# Patient Record
Sex: Female | Born: 1990 | Hispanic: Yes | Marital: Single | Smoking: Never smoker
Health system: Southern US, Community
[De-identification: ages and names within clinical notes are randomized; demographics above are authoritative.]

## PROBLEM LIST (undated history)

## (undated) ENCOUNTER — Inpatient Hospital Stay: Payer: Self-pay

## (undated) DIAGNOSIS — C539 Malignant neoplasm of cervix uteri, unspecified: Secondary | ICD-10-CM

## (undated) HISTORY — PX: OTHER SURGICAL HISTORY: SHX169

---
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
## 2009-09-28 ENCOUNTER — Emergency Department: Payer: Self-pay | Admitting: Emergency Medicine

## 2010-05-22 ENCOUNTER — Emergency Department: Payer: Self-pay | Admitting: Internal Medicine

## 2012-05-22 ENCOUNTER — Emergency Department: Payer: Self-pay | Admitting: Emergency Medicine

## 2012-05-22 LAB — COMPREHENSIVE METABOLIC PANEL
Albumin: 3.8 g/dL (ref 3.4–5.0)
Alkaline Phosphatase: 45 U/L — ABNORMAL LOW (ref 50–136)
BUN: 16 mg/dL (ref 7–18)
Bilirubin,Total: 0.6 mg/dL (ref 0.2–1.0)
Co2: 24 mmol/L (ref 21–32)
EGFR (African American): >60
EGFR (Non-African Amer.): >60
Osmolality: 274 (ref 275–301)
Potassium: 3.4 mmol/L — ABNORMAL LOW (ref 3.5–5.1)
SGPT (ALT): 77 U/L (ref 12–78)
Sodium: 137 mmol/L (ref 136–145)

## 2012-05-22 LAB — URINALYSIS, COMPLETE
Glucose,UR: NEGATIVE mg/dL (ref 0–75)
Leukocyte Esterase: NEGATIVE
Nitrite: NEGATIVE
Ph: 6 (ref 4.5–8.0)
RBC,UR: 1 /HPF (ref 0–5)

## 2012-05-22 LAB — CBC
HCT: 36.5 % (ref 35.0–47.0)
MCH: 28.2 pg (ref 26.0–34.0)
MCHC: 34 g/dL (ref 32.0–36.0)
MCV: 83 fL (ref 80–100)
RBC: 4.41 10*6/uL (ref 3.80–5.20)
WBC: 12.9 10*3/uL — ABNORMAL HIGH (ref 3.6–11.0)

## 2012-05-22 LAB — PREGNANCY, URINE: Pregnancy Test, Urine: POSITIVE m[IU]/mL

## 2012-12-01 ENCOUNTER — Emergency Department: Payer: Self-pay | Admitting: Unknown Physician Specialty

## 2012-12-01 LAB — CBC
HCT: 40.8 % (ref 35.0–47.0)
MCV: 84 fL (ref 80–100)
Platelet: 404 10*3/uL (ref 150–440)
WBC: 13.7 10*3/uL — ABNORMAL HIGH (ref 3.6–11.0)

## 2012-12-01 LAB — URINALYSIS, COMPLETE
Blood: NEGATIVE
Glucose,UR: NEGATIVE mg/dL (ref 0–75)
Nitrite: NEGATIVE
Ph: 6 (ref 4.5–8.0)
Protein: 100
RBC,UR: 8 /HPF (ref 0–5)
Specific Gravity: 1.028 (ref 1.003–1.030)
Squamous Epithelial: 9

## 2012-12-01 LAB — COMPREHENSIVE METABOLIC PANEL
Anion Gap: 7 (ref 7–16)
BUN: 15 mg/dL (ref 7–18)
Chloride: 105 mmol/L (ref 98–107)
Co2: 26 mmol/L (ref 21–32)
EGFR (Non-African Amer.): >60
Glucose: 91 mg/dL (ref 65–99)
Osmolality: 276 (ref 275–301)
Potassium: 3.2 mmol/L — ABNORMAL LOW (ref 3.5–5.1)
SGOT(AST): 29 U/L (ref 15–37)
SGPT (ALT): 50 U/L (ref 12–78)
Total Protein: 9.2 g/dL — ABNORMAL HIGH (ref 6.4–8.2)

## 2012-12-01 LAB — HCG, QUANTITATIVE, PREGNANCY: Beta Hcg, Quant.: 43930 m[IU]/mL — ABNORMAL HIGH

## 2013-06-27 ENCOUNTER — Ambulatory Visit: Payer: Self-pay | Admitting: Family Medicine

## 2014-04-15 ENCOUNTER — Emergency Department: Payer: Self-pay | Admitting: Emergency Medicine

## 2016-06-12 ENCOUNTER — Encounter: Payer: Self-pay | Admitting: Emergency Medicine

## 2016-06-12 ENCOUNTER — Emergency Department
Admission: EM | Admit: 2016-06-12 | Discharge: 2016-06-12 | Disposition: A | Discharge status: Home / Self Care | Payer: Medicaid Other | Attending: Emergency Medicine | Admitting: Emergency Medicine

## 2016-06-12 DIAGNOSIS — N390 Urinary tract infection, site not specified: Secondary | ICD-10-CM

## 2016-06-12 DIAGNOSIS — N72 Inflammatory disease of cervix uteri: Secondary | ICD-10-CM | POA: Diagnosis not present

## 2016-06-12 DIAGNOSIS — R300 Dysuria: Secondary | ICD-10-CM | POA: Diagnosis present

## 2016-06-12 LAB — URINALYSIS COMPLETE WITH MICROSCOPIC (ARMC ONLY)
Bacteria, UA: NONE SEEN
Bilirubin Urine: NEGATIVE
Glucose, UA: NEGATIVE mg/dL
Nitrite: NEGATIVE
PROTEIN: 100 mg/dL — AB
Specific Gravity, Urine: 1.029 (ref 1.005–1.030)
pH: 5 (ref 5.0–8.0)

## 2016-06-12 LAB — CHLAMYDIA/NGC RT PCR (ARMC ONLY)
Chlamydia Tr: NOT DETECTED
N gonorrhoeae: NOT DETECTED

## 2016-06-12 LAB — WET PREP, GENITAL
Clue Cells Wet Prep HPF POC: NONE SEEN
SPERM: NONE SEEN
Trich, Wet Prep: NONE SEEN
WBC, Wet Prep HPF POC: NONE SEEN
Yeast Wet Prep HPF POC: NONE SEEN

## 2016-06-12 LAB — POCT PREGNANCY, URINE: PREG TEST UR: NEGATIVE

## 2016-06-12 MED ORDER — CEFTRIAXONE SODIUM 1 G IJ SOLR
500.0000 mg | Freq: Once | INTRAMUSCULAR | Status: AC
  Administered 2016-06-12: 500 mg via INTRAMUSCULAR
  Filled 2016-06-12: qty 10

## 2016-06-12 MED ORDER — LIDOCAINE HCL (PF) 1 % IJ SOLN
2.1000 mL | Freq: Once | INTRAMUSCULAR | Status: AC
  Administered 2016-06-12: 2.1 mL
  Filled 2016-06-12: qty 5

## 2016-06-12 MED ORDER — CIPROFLOXACIN HCL 500 MG PO TABS: 500.0000 mg | ORAL_TABLET | Freq: Two times a day (BID) | ORAL | 0 refills | Status: DC

## 2016-06-12 MED ORDER — AZITHROMYCIN 500 MG PO TABS
1000.0000 mg | ORAL_TABLET | Freq: Every day | ORAL | Status: DC
  Administered 2016-06-12: 1000 mg via ORAL
  Filled 2016-06-12: qty 2

## 2016-06-12 MED ORDER — NAPROXEN 500 MG PO TABS: 500.0000 mg | ORAL_TABLET | Freq: Two times a day (BID) | ORAL | 0 refills | Status: DC

## 2016-06-12 NOTE — ED Triage Notes (Signed)
C/O dysuria and pelvic pain x 1 week.

## 2016-06-12 NOTE — ED Provider Notes (Signed)
[facility] Emergency Department Provider Note  ____________________________________________  Time seen: Approximately 6:45 PM  I have reviewed the triage vital signs and the nursing notes.   HISTORY  Chief Complaint Dysuria and Pelvic Pain    HPI Brenda Fox is a 25 y.o. female who presents to the emergency department for evaluation of dysuria x 1 week. Pain with intercourse. She is also concerned that she may be pregnant.Patient request that her husband/significant other not be present during discussion of HPI, exam, or review of labs/diagnosis.   History reviewed. No pertinent past medical history.  There are no active problems to display for this patient.   History reviewed. No pertinent surgical history.  Prior to Admission medications   Medication Sig Start Date End Date Taking? Authorizing Provider  ciprofloxacin (CIPRO) 500 MG tablet Take 1 tablet (500 mg total) by mouth 2 (two) times daily. 06/12/16   Victorino Dike, FNP  naproxen (NAPROSYN) 500 MG tablet Take 1 tablet (500 mg total) by mouth 2 (two) times daily with a meal. 06/12/16   Victorino Dike, FNP    Allergies Review of patient's allergies indicates no known allergies.  No family history on file.  Social History Social History  Substance Use Topics  . Smoking status: Never Smoker  . Smokeless tobacco: Never Used  . Alcohol use No    Review of Systems Constitutional: Positive for subjective fever. Respiratory: Negative for shortness of breath or cough. Gastrointestinal: Positive for abdominal pain; negative for nausea , negative for vomiting. Genitourinary: Positive for dysuria , negative for vaginal discharge. Musculoskeletal: Positive for back pain. Skin: Negative for lesion, rash, or wound. ____________________________________________   PHYSICAL EXAM:  VITAL SIGNS: ED Triage Vitals  Enc Vitals Group     BP 06/12/16 1758 122/70     Pulse Rate 06/12/16 1758 72   Resp 06/12/16 1758 16     Temp 06/12/16 1758 98.3 F (36.8 C)     Temp Source 06/12/16 1758 Oral     SpO2 06/12/16 1758 99 %     Weight 06/12/16 1759 150 lb (68 kg)     Height 06/12/16 1759 5\' 5"  (1.651 m)     Head Circumference --      Peak Flow --      Pain Score 06/12/16 1800 5     Pain Loc --      Pain Edu? --      Excl. in [place]? --     Constitutional: Alert and oriented. Well appearing and in no acute distress. Eyes: Conjunctivae are normal. PERRL. EOMI. Head: Atraumatic. Nose: No congestion/rhinnorhea. Mouth/Throat: Mucous membranes are moist. Respiratory: Normal respiratory effort.  No retractions. Gastrointestinal: Suprapubic tenderness on palpation. Genitourinary: Pelvic exam: No discharge noted in the vaginal vault or from the cervical os. Cervical Motion Tenderness present on bimanual exam.  Musculoskeletal: No extremity tenderness nor edema.  Neurologic:  Normal speech and language. No gross focal neurologic deficits are appreciated. Speech is normal. No gait instability. Skin:  Skin is warm, dry and intact. No rash noted. Psychiatric: Mood and affect are normal. Speech and behavior are normal.  ____________________________________________   LABS (all labs ordered are listed, but only abnormal results are displayed)  Labs Reviewed  URINALYSIS COMPLETEWITH MICROSCOPIC ([facility] ONLY) - Abnormal; Notable for the following:       Result Value   Color, Urine YELLOW (*)    APPearance CLOUDY (*)    Ketones, ur 1+ (*)    Hgb urine dipstick  1+ (*)    Protein, ur 100 (*)    Leukocytes, UA 3+ (*)    Squamous Epithelial / LPF 0-5 (*)    All other components within normal limits  CHLAMYDIA/NGC RT PCR ([facility] ONLY)  WET PREP, GENITAL  POC URINE PREG, ED  POCT PREGNANCY, URINE   ____________________________________________  RADIOLOGY  Not indicated.  ____________________________________________   PROCEDURES  Procedure(s) performed: Pelvic exam-see  assessment; specimens collected and sent to the lab.  ____________________________________________   INITIAL IMPRESSION / ASSESSMENT AND PLAN / ED COURSE  Clinical Course     Pertinent labs & imaging results that were available during my care of the patient were reviewed by me and considered in my medical decision making (see chart for details).  Chlamydia and gonorrhea testing pending. Due to CMT, IM injection of Rocephin and 1g of po azithromycin will be given in the emergency department. She will also be treated with Cipro for the UTI.  Patient will be given prescriptions for Cipro today. She was advised to follow up with gynecology for symptoms that are not improving over the week. She was advised to return to the ER for symptoms that change or worsen if unable to schedule an appointment.  Request for husband/significant other to not be present honored. Patient did state that she feels safe at home. ____________________________________________   FINAL CLINICAL IMPRESSION(S) / ED DIAGNOSES  Final diagnoses:  UTI (lower urinary tract infection)  Cervicitis, acute, non-specific    Note:  This document was prepared using Dragon voice recognition software and may include unintentional dictation errors.    Victorino Dike, FNP 06/12/16 2125    Hinda Kehr, MD 06/12/16 2321

## 2016-10-09 ENCOUNTER — Emergency Department
Admission: EM | Admit: 2016-10-09 | Discharge: 2016-10-09 | Disposition: A | Discharge status: Home / Self Care | Payer: Medicaid Other | Attending: Emergency Medicine | Admitting: Emergency Medicine

## 2016-10-09 DIAGNOSIS — R1110 Vomiting, unspecified: Secondary | ICD-10-CM | POA: Diagnosis present

## 2016-10-09 DIAGNOSIS — K529 Noninfective gastroenteritis and colitis, unspecified: Secondary | ICD-10-CM | POA: Insufficient documentation

## 2016-10-09 LAB — POCT PREGNANCY, URINE: Preg Test, Ur: NEGATIVE

## 2016-10-09 MED ORDER — ONDANSETRON HCL 4 MG/2ML IJ SOLN
4.0000 mg | Freq: Once | INTRAMUSCULAR | Status: AC
  Administered 2016-10-09: 4 mg via INTRAVENOUS
  Filled 2016-10-09: qty 2

## 2016-10-09 MED ORDER — ONDANSETRON HCL 4 MG PO TABS: 4.0000 mg | ORAL_TABLET | Freq: Every day | ORAL | 1 refills | Status: DC | PRN

## 2016-10-09 MED ORDER — ACETAMINOPHEN 325 MG PO TABS
650.0000 mg | ORAL_TABLET | Freq: Once | ORAL | Status: AC
  Administered 2016-10-09: 650 mg via ORAL
  Filled 2016-10-09: qty 2

## 2016-10-09 MED ORDER — SODIUM CHLORIDE 0.9 % IV BOLUS (SEPSIS)
1000.0000 mL | Freq: Once | INTRAVENOUS | Status: AC
  Administered 2016-10-09: 1000 mL via INTRAVENOUS

## 2016-10-09 NOTE — ED Notes (Signed)
See triage note   States she developed some vomiting yesterday  Thinks she may be pregnant   Denies any pain   Low grade fever

## 2016-10-09 NOTE — ED Triage Notes (Signed)
Pt started vomiting yesterday and is unable to hold anything down - denies diarrhea - c/o weakness - denies any pain

## 2016-10-09 NOTE — ED Provider Notes (Signed)
[facility] Emergency Department Provider Note  ____________________________________________  Time seen: Approximately 1:11 PM  I have reviewed the triage vital signs and the nursing notes.   HISTORY  Chief Complaint Emesis    HPI Brenda Fox is a 26 y.o. female that presents to the emergency department with one day of vomiting. Patient states that she has not been able to keep any food down. Patient has been drinking ginger ale without vomiting. She states that she would like to receive IV fluids. Patient denies any recent illness. Patient denies fever, congestion, sore throat, cough, shortness of breath, chest pain, abdominal pain, dysuria, diarrhea, constipation.   History reviewed. No pertinent past medical history.  There are no active problems to display for this patient.   History reviewed. No pertinent surgical history.  Prior to Admission medications   Medication Sig Start Date End Date Taking? Authorizing Provider  ondansetron (ZOFRAN) 4 MG tablet Take 1 tablet (4 mg total) by mouth daily as needed for nausea or vomiting. 10/09/16 10/09/17  Laban Emperor, PA-C    Allergies Patient has no known allergies.  No family history on file.  Social History Social History  Substance Use Topics  . Smoking status: Never Smoker  . Smokeless tobacco: Never Used  . Alcohol use No     Review of Systems  Constitutional: No fever/chills ENT: No upper respiratory complaints. Cardiovascular: No chest pain. Respiratory: No cough. No SOB. Gastrointestinal: No abdominal pain.   Genitourinary: Negative for dysuria. Musculoskeletal: Negative for musculoskeletal pain. Skin: Negative for rash, abrasions, lacerations, ecchymosis. Neurological: Negative for headaches, numbness or tingling   ____________________________________________   PHYSICAL EXAM:  VITAL SIGNS: ED Triage Vitals  Enc Vitals Group     BP 10/09/16 1209 124/64     Pulse Rate 10/09/16  1209 (!) 124     Resp 10/09/16 1209 16     Temp 10/09/16 1209 99.5 F (37.5 C)     Temp Source 10/09/16 1209 Oral     SpO2 10/09/16 1209 100 %     Weight 10/09/16 1210 150 lb (68 kg)     Height 10/09/16 1210 5\' 5"  (1.651 m)     Head Circumference --      Peak Flow --      Pain Score 10/09/16 1210 0     Pain Loc --      Pain Edu? --      Excl. in [place]? --      Constitutional: Alert and oriented. Well appearing and in no acute distress. Eyes: Conjunctivae are normal. PERRL. EOMI. Head: Atraumatic. ENT:      Ears:      Nose: No congestion/rhinnorhea.      Mouth/Throat: Mucous membranes are moist.  Neck: No stridor.   Cardiovascular: Normal rate, regular rhythm.  Good peripheral circulation. Respiratory: Normal respiratory effort without tachypnea or retractions. Lungs CTAB. Good air entry to the bases with no decreased or absent breath sounds. Gastrointestinal: Bowel sounds 4 quadrants. Soft and nontender to palpation. No guarding or rigidity. No palpable masses. No distention. No CVA tenderness. Musculoskeletal: Full range of motion to all extremities. No gross deformities appreciated. Neurologic:  Normal speech and language. No gross focal neurologic deficits are appreciated.  Skin:  Skin is warm, dry and intact. No rash noted. Psychiatric: Mood and affect are normal. Speech and behavior are normal. Patient exhibits appropriate insight and judgement.   ____________________________________________   LABS (all labs ordered are listed, but only abnormal results are displayed)  Labs Reviewed  POC URINE PREG, ED  POCT PREGNANCY, URINE   ____________________________________________  EKG   ____________________________________________  RADIOLOGY   No results found.  ____________________________________________    PROCEDURES  Procedure(s) performed:    Procedures    Medications  sodium chloride 0.9 % bolus 1,000 mL (1,000 mLs Intravenous New Bag/Given 10/09/16  1330)  ondansetron (ZOFRAN) injection 4 mg (4 mg Intravenous Given 10/09/16 1330)  acetaminophen (TYLENOL) tablet 650 mg (650 mg Oral Given 10/09/16 1331)     ____________________________________________   INITIAL IMPRESSION / ASSESSMENT AND PLAN / ED COURSE  Pertinent labs & imaging results that were available during my care of the patient were reviewed by me and considered in my medical decision making (see chart for details).  Review of the [place] CSRS was performed in accordance of the [place] prior to dispensing any controlled drugs.     Patient's diagnosis is consistent with gastroenteritis. Vital signs and exam are reassuring. Patient's abdomen is nontender to palpation. Patient felt better after IV fluids and Zofran. Patient is drinking ginger ale and eating crackers in ED without vomiting. Patient will be discharged home with prescriptions for Zofran. Patient is to follow up with PCP as directed. Patient is given ED precautions to return to the ED for any worsening or new symptoms.     ____________________________________________  FINAL CLINICAL IMPRESSION(S) / ED DIAGNOSES  Final diagnoses:  Gastroenteritis      NEW MEDICATIONS STARTED DURING THIS VISIT:  New Prescriptions   ONDANSETRON (ZOFRAN) 4 MG TABLET    Take 1 tablet (4 mg total) by mouth daily as needed for nausea or vomiting.        This chart was dictated using voice recognition software/Dragon. Despite best efforts to proofread, errors can occur which can change the meaning. Any change was purely unintentional.    Laban Emperor, PA-C 10/09/16 1615    Lavonia Drafts, MD 10/10/16 [phone]

## 2017-09-18 NOTE — L&D Delivery Note (Addendum)
Delivery Note At 9:23 PM a viable female was delivered via Vaginal, Spontaneous (Presentation:straight OA ) after pt was noted to be C/C/+4 station. Pt pushed x 2 with delivery of the vtx.: with no CAN, Vtx, ant and post shoulder and body del completely and to mom's abd. Very thick cord noted. Delayed cord clamping and clamped x 2 and daughter cut cord, 3 VC noted. Infant crying and pinkening to stimulation. SDOP intact at  2128 delivered completely in [place] presentation  and an accessory lobe noted but, still attached to Lt side of placenta, Cord insertion is central. Placenta insepected with no missing cotlyedons. No mec staining noted. ROM was never visualized nor AROM but at 1718, RN checked and did not palpate membranes only hair on fetal head so ruptured occurred prior to or around that time. Clear fluid noted at delivery. Cord blood not needed as pt is A pos. Baby to mom's chest for skin to skin after baby dried off.  Anesthesia:  Epidural for delivery Episiotomy: None Lacerations: None Suture Repair: None Est. Blood Loss (mL):  100 ml's Apgars:9/9 Weight: #  oz Mom to post partum status.  Baby to mom's chest for skin to skin bonding. No needles or sponges used. VSS. Hemostasis achieved. Pt has not named baby yet.   ___________________________ Danford Bad, MSN, CNM, FNP Certified Nurse Midwife [facility]

## 2017-09-24 ENCOUNTER — Encounter: Payer: Self-pay | Admitting: Intensive Care

## 2017-09-24 ENCOUNTER — Emergency Department: Payer: Medicaid Other

## 2017-09-24 ENCOUNTER — Emergency Department
Admission: EM | Admit: 2017-09-24 | Discharge: 2017-09-24 | Disposition: A | Discharge status: Home / Self Care | Payer: Medicaid Other | Attending: Emergency Medicine | Admitting: Emergency Medicine

## 2017-09-24 DIAGNOSIS — O219 Vomiting of pregnancy, unspecified: Secondary | ICD-10-CM | POA: Diagnosis present

## 2017-09-24 DIAGNOSIS — Z3490 Encounter for supervision of normal pregnancy, unspecified, unspecified trimester: Secondary | ICD-10-CM

## 2017-09-24 DIAGNOSIS — Z3A01 Less than 8 weeks gestation of pregnancy: Secondary | ICD-10-CM | POA: Diagnosis not present

## 2017-09-24 LAB — URINALYSIS, COMPLETE (UACMP) WITH MICROSCOPIC
BACTERIA UA: NONE SEEN
Bilirubin Urine: NEGATIVE
GLUCOSE, UA: NEGATIVE mg/dL
Hgb urine dipstick: NEGATIVE
KETONES UR: 20 mg/dL — AB
Leukocytes, UA: NEGATIVE
NITRITE: NEGATIVE
PROTEIN: NEGATIVE mg/dL
Specific Gravity, Urine: 1.025 (ref 1.005–1.030)
pH: 5 (ref 5.0–8.0)

## 2017-09-24 LAB — HCG, QUANTITATIVE, PREGNANCY: hCG, Beta Chain, Quant, S: 80260 m[IU]/mL — ABNORMAL HIGH (ref <5)

## 2017-09-24 LAB — CBC WITH DIFFERENTIAL/PLATELET
BASOS ABS: 0 10*3/uL (ref 0–0.1)
BASOS PCT: 0 %
EOS ABS: 0 10*3/uL (ref 0–0.7)
Eosinophils Relative: 0 %
HEMATOCRIT: 38.4 % (ref 35.0–47.0)
Hemoglobin: 12.7 g/dL (ref 12.0–16.0)
Lymphocytes Relative: 10 %
Lymphs Abs: 1.4 10*3/uL (ref 1.0–3.6)
MCH: 27.6 pg (ref 26.0–34.0)
MCHC: 33.1 g/dL (ref 32.0–36.0)
MCV: 83.3 fL (ref 80.0–100.0)
Monocytes Absolute: 0.5 10*3/uL (ref 0.2–0.9)
Monocytes Relative: 4 %
NEUTROS ABS: 11.8 10*3/uL — AB (ref 1.4–6.5)
NEUTROS PCT: 86 %
Platelets: 379 10*3/uL (ref 150–440)
RBC: 4.6 MIL/uL (ref 3.80–5.20)
RDW: 13.8 % (ref 11.5–14.5)
WBC: 13.8 10*3/uL — AB (ref 3.6–11.0)

## 2017-09-24 LAB — COMPREHENSIVE METABOLIC PANEL
ALK PHOS: 49 U/L (ref 38–126)
ALT: 20 U/L (ref 14–54)
ANION GAP: 7 (ref 5–15)
AST: 20 U/L (ref 15–41)
Albumin: 3.8 g/dL (ref 3.5–5.0)
BILIRUBIN TOTAL: 0.6 mg/dL (ref 0.3–1.2)
BUN: 11 mg/dL (ref 6–20)
CALCIUM: 9.1 mg/dL (ref 8.9–10.3)
CO2: 23 mmol/L (ref 22–32)
Chloride: 104 mmol/L (ref 101–111)
Creatinine, Ser: 0.65 mg/dL (ref 0.44–1.00)
GFR calc Af Amer: >60 mL/min (ref >60)
GFR calc non Af Amer: >60 mL/min (ref >60)
GLUCOSE: 86 mg/dL (ref 65–99)
Potassium: 3.6 mmol/L (ref 3.5–5.1)
SODIUM: 134 mmol/L — AB (ref 135–145)
TOTAL PROTEIN: 7.4 g/dL (ref 6.5–8.1)

## 2017-09-24 LAB — POCT PREGNANCY, URINE: Preg Test, Ur: POSITIVE — AB

## 2017-09-24 MED ORDER — PRENATAL VITAMINS 0.8 MG PO TABS: 1.0000 | ORAL_TABLET | Freq: Every day | ORAL | 0 refills | Status: DC

## 2017-09-24 MED ORDER — SODIUM CHLORIDE 0.9 % IV BOLUS (SEPSIS)
1000.0000 mL | Freq: Once | INTRAVENOUS | Status: AC
  Administered 2017-09-24: 1000 mL via INTRAVENOUS

## 2017-09-24 MED ORDER — VITAMIN B-6 25 MG PO TABS: ORAL_TABLET | ORAL | 0 refills | Status: DC

## 2017-09-24 MED ORDER — ONDANSETRON HCL 4 MG/2ML IJ SOLN
4.0000 mg | Freq: Once | INTRAMUSCULAR | Status: AC
  Administered 2017-09-24: 4 mg via INTRAVENOUS
  Filled 2017-09-24: qty 2

## 2017-09-24 MED ORDER — ONDANSETRON HCL 4 MG PO TABS: 4.0000 mg | ORAL_TABLET | Freq: Three times a day (TID) | ORAL | 0 refills | Status: DC | PRN

## 2017-09-24 NOTE — Discharge Instructions (Signed)
Follow up with Brenda Fox or Ob/gyn for prenatal care and for repeat ultrasound to confirm mass on ovary is a cyst.

## 2017-09-24 NOTE — ED Provider Notes (Signed)
-----------------------------------------   5:53 PM on 09/24/2017 -----------------------------------------   Blood pressure 122/64, pulse 70, temperature 98.6 F (37 C), temperature source Oral, resp. rate 16, height 5\' 5"  (1.651 m), weight 68 kg (150 lb), last menstrual period 08/24/2017, SpO2 99 %.  Assuming care from Ashok Cordia.  In short, Brenda Fox is a 27 y.o. female with a chief complaint of Emesis . Refer to the original H&P for additional details. Pregnancy test in ED is positive. Ultrasound in ED shows intrauterine pregnancy with a gestational age of [redacted] weeks and 4 days.  Ultrasound also indicated septated cystic structure in left ovary that is consistent with a possible hemorrhagic cyst. All findings were discussed with patient. She was given a prescription for prenatal vitamins, vitamin B6 and zofran for nausea and vomiting. Patient will follow up with PCP and Ob/gyn for repeat ultrasound and prenatal care.         Laban Emperor, PA-C 09/24/17 1757    Lavonia Drafts, MD 09/24/17 [phone]

## 2017-09-24 NOTE — ED Notes (Signed)
Awaiting for u/s  No vomiting since arrival to ED  Family at bedside

## 2017-09-24 NOTE — ED Notes (Addendum)
See triage note  States she developed some n/v about 4 days ago  Last time vomited was this am  Was able to eat yesterday   States she had subjective fever   But afebrile on arrival  Also had some lower abd pain yesterday  Denies any today

## 2017-09-24 NOTE — ED Provider Notes (Signed)
[facility] Emergency Department Provider Note  ____________________________________________   First MD Initiated Contact with Patient 09/24/17 1209     (approximate)  I have reviewed the triage vital signs and the nursing notes.   HISTORY  Chief Complaint Emesis    HPI Jenise Iannelli is a 27 y.o. female is complaining of vomiting for  4 days, she might have a fever but she just felt warm,  she did not take her temp, she denies any abdominal pain, vaginal discharge or bleeding, when questioned if she could be pregnant the patient just shrugged her shoulders  History reviewed. No pertinent past medical history.  There are no active problems to display for this patient.   History reviewed. No pertinent surgical history.  Prior to Admission medications   Medication Sig Start Date End Date Taking? Authorizing Provider  ondansetron (ZOFRAN) 4 MG tablet Take 1 tablet (4 mg total) by mouth daily as needed for nausea or vomiting. 10/09/16 10/09/17  Laban Emperor, PA-C    Allergies Patient has no known allergies.  History reviewed. No pertinent family history.  Social History Social History   Tobacco Use  . Smoking status: Never Smoker  . Smokeless tobacco: Never Used  Substance Use Topics  . Alcohol use: No  . Drug use: No    Review of Systems  Constitutional: No fever/chills Eyes: No visual changes. ENT: No sore throat. Respiratory: Denies cough GI: positive vomiting Genitourinary: Negative for dysuria. Musculoskeletal: Negative for back pain. Skin: Negative for rash.    ____________________________________________   PHYSICAL EXAM:  VITAL SIGNS: ED Triage Vitals  Enc Vitals Group     BP 09/24/17 1133 128/63     Pulse Rate 09/24/17 1133 75     Resp 09/24/17 1133 14     Temp 09/24/17 1135 98.6 F (37 C)     Temp Source 09/24/17 1133 Oral     SpO2 09/24/17 1133 100 %     Weight 09/24/17 1133 150 lb (68 kg)     Height 09/24/17  1133 5\' 5"  (1.651 m)     Head Circumference --      Peak Flow --      Pain Score --      Pain Loc --      Pain Edu? --      Excl. in [place]? --     Constitutional: Alert and oriented. Well appearing and in no acute distress. Eyes: Conjunctivae are normal.  Head: Atraumatic. Nose: No congestion/rhinnorhea. Mouth/Throat: Mucous membranes are moist.  Throat is normal Cardiovascular: Normal rate, regular rhythm. Heart sounds are normal Respiratory: Normal respiratory effort.  No retractions, lungs c t a GU: deferred Musculoskeletal: FROM all extremities, warm and well perfused Neurologic:  Normal speech and language.  Skin:  Skin is warm, dry and intact. No rash noted. Psychiatric: Mood and affect are normal. Speech and behavior are normal.  ____________________________________________   LABS (all labs ordered are listed, but only abnormal results are displayed)  Labs Reviewed  URINALYSIS, COMPLETE (UACMP) WITH MICROSCOPIC - Abnormal; Notable for the following components:      Result Value   Color, Urine YELLOW (*)    APPearance CLEAR (*)    Ketones, ur 20 (*)    Squamous Epithelial / LPF 0-5 (*)    All other components within normal limits  CBC WITH DIFFERENTIAL/PLATELET - Abnormal; Notable for the following components:   WBC 13.8 (*)    Neutro Abs 11.8 (*)    All  other components within normal limits  COMPREHENSIVE METABOLIC PANEL - Abnormal; Notable for the following components:   Sodium 134 (*)    All other components within normal limits  HCG, QUANTITATIVE, PREGNANCY - Abnormal; Notable for the following components:   hCG, Beta Chain, Quant, S 80,260 (*)    All other components within normal limits  POCT PREGNANCY, URINE - Abnormal; Notable for the following components:   Preg Test, Ur POSITIVE (*)    All other components within normal limits  POC URINE PREG, ED    ____________________________________________   ____________________________________________  RADIOLOGY    ____________________________________________   PROCEDURES  Procedure(s) performed: saline lock, ns 1liter, zofran 4mg , labs ordered      ____________________________________________   INITIAL IMPRESSION / ASSESSMENT AND PLAN / ED COURSE  Pertinent labs & imaging results that were available during my care of the patient were reviewed by me and considered in my medical decision making (see chart for details).  Patient is a 27 year old female presents to the emergency department due to vomiting for 4 days, pregnancy status is unknown, on physical exam she appears well, urine pregnancy test is positive, there are 20 ketones in her urine, IV saline 1 L, 4 mg of Zofran, with labs ordered    ----------------------------------------- 3:06 PM on 09/24/2017 -----------------------------------------  Patient was sent to ultrasound for pelvic and transvaginal, she states she did have some pain yesterday so we will need to ensure this is not an ectopic, discussed the patient with Jen Mow, PA-C she is taking over the care of this patient at this time As part of my medical decision making, I reviewed the following data within the [place]  ____________________________________________   FINAL CLINICAL IMPRESSION(S) / ED DIAGNOSES  Final diagnoses:  Pregnancy, unspecified gestational age  Nausea and vomiting in pregnancy prior to [redacted] weeks gestation      NEW MEDICATIONS STARTED DURING THIS VISIT:  This SmartLink is deprecated. Use AVSMEDLIST instead to display the medication list for a patient.   Note:  This document was prepared using Dragon voice recognition software and may include unintentional dictation errors.    Versie Starks, PA-C 09/24/17 1507    Harvest Dark, MD 09/24/17 [phone]

## 2017-09-24 NOTE — ED Triage Notes (Addendum)
Patient c/o fever and emesis for a couple of days. Ambulatory in triage with no problems. A&O x4. Denies taking any OTC meds today. No fever upon arrival. No distress noted

## 2017-09-28 ENCOUNTER — Other Ambulatory Visit: Payer: Self-pay

## 2017-09-28 ENCOUNTER — Encounter: Payer: Self-pay | Admitting: Emergency Medicine

## 2017-09-28 ENCOUNTER — Emergency Department
Admission: EM | Admit: 2017-09-28 | Discharge: 2017-09-28 | Disposition: A | Discharge status: Home / Self Care | Payer: Medicaid Other | Attending: Emergency Medicine | Admitting: Emergency Medicine

## 2017-09-28 DIAGNOSIS — O210 Mild hyperemesis gravidarum: Secondary | ICD-10-CM

## 2017-09-28 LAB — CBC WITH DIFFERENTIAL/PLATELET
Basophils Absolute: 0.1 10*3/uL (ref 0–0.1)
Basophils Relative: 1 %
Eosinophils Absolute: 0 10*3/uL (ref 0–0.7)
Eosinophils Relative: 0 %
HEMATOCRIT: 42.4 % (ref 35.0–47.0)
HEMOGLOBIN: 14 g/dL (ref 12.0–16.0)
LYMPHS ABS: 1.5 10*3/uL (ref 1.0–3.6)
LYMPHS PCT: 9 %
MCH: 27.2 pg (ref 26.0–34.0)
MCHC: 33 g/dL (ref 32.0–36.0)
MCV: 82.4 fL (ref 80.0–100.0)
MONOS PCT: 4 %
Monocytes Absolute: 0.7 10*3/uL (ref 0.2–0.9)
NEUTROS ABS: 15.1 10*3/uL — AB (ref 1.4–6.5)
NEUTROS PCT: 86 %
Platelets: 441 10*3/uL — ABNORMAL HIGH (ref 150–440)
RBC: 5.15 MIL/uL (ref 3.80–5.20)
RDW: 13.3 % (ref 11.5–14.5)
WBC: 17.3 10*3/uL — ABNORMAL HIGH (ref 3.6–11.0)

## 2017-09-28 LAB — BASIC METABOLIC PANEL
Anion gap: 12 (ref 5–15)
BUN: 18 mg/dL (ref 6–20)
CHLORIDE: 104 mmol/L (ref 101–111)
CO2: 22 mmol/L (ref 22–32)
Calcium: 9.7 mg/dL (ref 8.9–10.3)
Creatinine, Ser: 0.82 mg/dL (ref 0.44–1.00)
GFR calc Af Amer: >60 mL/min (ref >60)
GFR calc non Af Amer: >60 mL/min (ref >60)
GLUCOSE: 130 mg/dL — AB (ref 65–99)
POTASSIUM: 3.6 mmol/L (ref 3.5–5.1)
SODIUM: 138 mmol/L (ref 135–145)

## 2017-09-28 LAB — HEPATIC FUNCTION PANEL
ALBUMIN: 4.3 g/dL (ref 3.5–5.0)
ALT: 28 U/L (ref 14–54)
AST: 24 U/L (ref 15–41)
Alkaline Phosphatase: 54 U/L (ref 38–126)
Total Bilirubin: 0.8 mg/dL (ref 0.3–1.2)
Total Protein: 8.5 g/dL — ABNORMAL HIGH (ref 6.5–8.1)

## 2017-09-28 LAB — LIPASE, BLOOD: Lipase: 38 U/L (ref 11–51)

## 2017-09-28 MED ORDER — PROMETHAZINE HCL 25 MG/ML IJ SOLN
12.5000 mg | Freq: Once | INTRAMUSCULAR | Status: AC
  Administered 2017-09-28: 12.5 mg via INTRAVENOUS

## 2017-09-28 MED ORDER — LACTATED RINGERS IV BOLUS (SEPSIS)
1000.0000 mL | Freq: Once | INTRAVENOUS | Status: AC
  Administered 2017-09-28: 1000 mL via INTRAVENOUS
  Filled 2017-09-28: qty 1000

## 2017-09-28 MED ORDER — SODIUM CHLORIDE 0.9 % IV SOLN
1000.0000 mL | INTRAVENOUS | Status: DC
  Administered 2017-09-28: 1000 mL via INTRAVENOUS

## 2017-09-28 MED ORDER — ONDANSETRON HCL 4 MG/2ML IJ SOLN
4.0000 mg | Freq: Once | INTRAMUSCULAR | Status: AC
  Administered 2017-09-28: 4 mg via INTRAVENOUS
  Filled 2017-09-28: qty 2

## 2017-09-28 MED ORDER — PROMETHAZINE HCL 25 MG/ML IJ SOLN
INTRAMUSCULAR | Status: DC
Start: 2017-09-28 — End: 2017-09-28
  Filled 2017-09-28: qty 1

## 2017-09-28 MED ORDER — PROMETHAZINE HCL 12.5 MG PO TABS: 12.5000 mg | ORAL_TABLET | Freq: Four times a day (QID) | ORAL | 0 refills | Status: DC | PRN

## 2017-09-28 MED ORDER — SODIUM CHLORIDE 0.9 % IV BOLUS (SEPSIS): 1000.0000 mL | Freq: Once | INTRAVENOUS | Status: DC

## 2017-09-28 MED ORDER — PROMETHAZINE HCL 25 MG RE SUPP: 25.0000 mg | Freq: Four times a day (QID) | RECTAL | 1 refills | Status: DC | PRN

## 2017-09-28 NOTE — ED Triage Notes (Signed)
Pt reports that she is [redacted] weeks pregnant and has not been able to deep anything down since Tuesday. Pt mucous membranes are dry and she is actively vomiting during triage.

## 2017-09-28 NOTE — ED Provider Notes (Addendum)
[facility] Emergency Department Provider Note  ____________________________________________   I have reviewed the triage vital signs and the nursing notes. Where available I have reviewed prior notes and, if possible and indicated, outside hospital notes.    HISTORY  Chief Complaint Emesis During Pregnancy    HPI Brenda Fox is a 27 y.o. female who is G4 P1, 2 TAB in the past, with a known 7-week intrauterine pregnancy from recent ultrasound suffers from hyperemesis gravidarum, has been vomiting off and on for the last several days.  Has tried home Zofran with little success.  Denies any fever chills denies abdominal pain unless she is in the act  of vomiting at that time is epigastric cramping.Marland Kitchen  Has taken home Zofran and B6 with no relief, nothing else makes it better or worse.  No radiation pain is mild and only with vomiting, multiple bouts of emesis today and yesterday, has been seen for this on the seventh.    History reviewed. No pertinent past medical history.  There are no active problems to display for this patient.   History reviewed. No pertinent surgical history.  Prior to Admission medications   Medication Sig Start Date End Date Taking? Authorizing Provider  ondansetron (ZOFRAN) 4 MG tablet Take 1 tablet (4 mg total) by mouth 3 (three) times daily as needed for nausea or vomiting. 09/24/17 09/24/18  Laban Emperor, PA-C  Prenatal Multivit-Min-Fe-FA (PRENATAL VITAMINS) 0.8 MG tablet Take 1 tablet by mouth daily. 09/24/17   Laban Emperor, PA-C  vitamin B-6 (PYRIDOXINE) 25 MG tablet Take 1 tablet every 6-8 hours as needed for nausea and vomiting. 09/24/17   Laban Emperor, PA-C    Allergies Patient has no known allergies.  No family history on file.  Social History Social History   Tobacco Use  . Smoking status: Never Smoker  . Smokeless tobacco: Never Used  Substance Use Topics  . Alcohol use: No  . Drug use: No    Review of  Systems Constitutional: No fever/chills Eyes: No visual changes. ENT: No sore throat. No stiff neck no neck pain Cardiovascular: Denies chest pain. Respiratory: Denies shortness of breath. Gastrointestinal:   + vomiting.  No diarrhea.  No constipation. Genitourinary: Negative for dysuria. Musculoskeletal: Negative lower extremity swelling Skin: Negative for rash. Neurological: Negative for severe headaches, focal weakness or numbness.   ____________________________________________   PHYSICAL EXAM:  VITAL SIGNS: ED Triage Vitals  Enc Vitals Group     BP 09/28/17 1326 (!) 146/62     Pulse Rate 09/28/17 1326 74     Resp 09/28/17 1326 16     Temp 09/28/17 1326 98.4 F (36.9 C)     Temp Source 09/28/17 1326 Oral     SpO2 09/28/17 1326 100 %     Weight 09/28/17 1326 150 lb (68 kg)     Height 09/28/17 1326 5\' 5"  (1.651 m)     Head Circumference --      Peak Flow --      Pain Score 09/28/17 1325 0     Pain Loc --      Pain Edu? --      Excl. in [place]? --     Constitutional: Alert and oriented. Well appearing and in no acute distress. Eyes: Conjunctivae are normal Head: Atraumatic HEENT: No congestion/rhinnorhea. Mucous membranes are moist.  Oropharynx non-erythematous Neck:   Nontender with no meningismus, no masses, no stridor Cardiovascular: Normal rate, regular rhythm. Grossly normal heart sounds.  Good peripheral circulation. Respiratory:  Normal respiratory effort.  No retractions. Lungs CTAB. Abdominal: Soft and nontender. No distention. No guarding no rebound Back:  There is no focal tenderness or step off.  there is no midline tenderness there are no lesions noted. there is no CVA tenderness Musculoskeletal: No lower extremity tenderness, no upper extremity tenderness. No joint effusions, no DVT signs strong distal pulses no edema Neurologic:  Normal speech and language. No gross focal neurologic deficits are appreciated.  Skin:  Skin is warm, dry and intact. No rash  noted. Psychiatric: Mood and affect are normal. Speech and behavior are normal.  ____________________________________________   LABS (all labs ordered are listed, but only abnormal results are displayed)  Labs Reviewed  CBC WITH DIFFERENTIAL/PLATELET - Abnormal; Notable for the following components:      Result Value   WBC 17.3 (*)    Platelets 441 (*)    Neutro Abs 15.1 (*)    All other components within normal limits  BASIC METABOLIC PANEL - Abnormal; Notable for the following components:   Glucose, Bld 130 (*)    All other components within normal limits  URINALYSIS, COMPLETE (UACMP) WITH MICROSCOPIC    Pertinent labs  results that were available during my care of the patient were reviewed by me and considered in my medical decision making (see chart for details). ____________________________________________  EKG  I personally interpreted any EKGs ordered by me or triage  ____________________________________________  RADIOLOGY  Pertinent labs & imaging results that were available during my care of the patient were reviewed by me and considered in my medical decision making (see chart for details). If possible, patient and/or family made aware of any abnormal findings.  No results found. ____________________________________________    PROCEDURES  Procedure(s) performed: None  Procedures  Critical Care performed: None  ____________________________________________   INITIAL IMPRESSION / ASSESSMENT AND PLAN / ED COURSE  Pertinent labs & imaging results that were available during my care of the patient were reviewed by me and considered in my medical decision making (see chart for details).  Patient with hyperemesis gravidarum at this point there is nothing to suggest she has appendicitis gallbladder disease or other intra-abdominal pathology of that variety however we will check liver function tests and lipase, we are giving her IV fluid, will discuss with her OB  what they prefer to give her as she has not been able to keep anything down despite Zofran and B6 ----------------------------------------- 4:29 PM on 09/28/2017 -----------------------------------------  No vomiting here we will give her Phenergan, she did take a Zofran immediately prior to coming in but still complains of nausea.  Abdomen is soft and nontender, patient has been able to tolerate ice, if we can tolerate p.o. we will send her home with as needed p.o. Phenergan as needed.  I did discuss with Dr. Leafy Ro of OB/GYN she agrees with this plan and they will see her as an outpatient.  Appreciate the consult.    ____________________________________________   FINAL CLINICAL IMPRESSION(S) / ED DIAGNOSES  Final diagnoses:  None      This chart was dictated using voice recognition software.  Despite best efforts to proofread,  errors can occur which can change meaning.      Schuyler Amor, MD 09/28/17 1515    Schuyler Amor, MD 09/28/17 [phone]

## 2017-10-15 ENCOUNTER — Encounter: Payer: Self-pay | Admitting: Intensive Care

## 2017-10-15 ENCOUNTER — Emergency Department
Admission: EM | Admit: 2017-10-15 | Discharge: 2017-10-15 | Disposition: A | Discharge status: Home / Self Care | Payer: Medicaid Other | Attending: Emergency Medicine | Admitting: Emergency Medicine

## 2017-10-15 DIAGNOSIS — O2341 Unspecified infection of urinary tract in pregnancy, first trimester: Secondary | ICD-10-CM | POA: Diagnosis not present

## 2017-10-15 DIAGNOSIS — O210 Mild hyperemesis gravidarum: Secondary | ICD-10-CM | POA: Insufficient documentation

## 2017-10-15 DIAGNOSIS — O219 Vomiting of pregnancy, unspecified: Secondary | ICD-10-CM | POA: Diagnosis present

## 2017-10-15 DIAGNOSIS — Z3A09 9 weeks gestation of pregnancy: Secondary | ICD-10-CM | POA: Diagnosis not present

## 2017-10-15 DIAGNOSIS — N390 Urinary tract infection, site not specified: Secondary | ICD-10-CM

## 2017-10-15 LAB — URINALYSIS, ROUTINE W REFLEX MICROSCOPIC
Bilirubin Urine: NEGATIVE
Glucose, UA: NEGATIVE mg/dL
Hgb urine dipstick: NEGATIVE
KETONES UR: 80 mg/dL — AB
Leukocytes, UA: NEGATIVE
Nitrite: POSITIVE — AB
PROTEIN: NEGATIVE mg/dL
Specific Gravity, Urine: 1.023 (ref 1.005–1.030)
pH: 5 (ref 5.0–8.0)

## 2017-10-15 LAB — COMPREHENSIVE METABOLIC PANEL
ALBUMIN: 4.2 g/dL (ref 3.5–5.0)
ALT: 45 U/L (ref 14–54)
AST: 38 U/L (ref 15–41)
Alkaline Phosphatase: 51 U/L (ref 38–126)
Anion gap: 12 (ref 5–15)
BUN: 15 mg/dL (ref 6–20)
CHLORIDE: 103 mmol/L (ref 101–111)
CO2: 21 mmol/L — ABNORMAL LOW (ref 22–32)
Calcium: 9.7 mg/dL (ref 8.9–10.3)
Creatinine, Ser: 0.68 mg/dL (ref 0.44–1.00)
GFR calc Af Amer: >60 mL/min (ref >60)
Glucose, Bld: 109 mg/dL — ABNORMAL HIGH (ref 65–99)
POTASSIUM: 4 mmol/L (ref 3.5–5.1)
Sodium: 136 mmol/L (ref 135–145)
Total Bilirubin: 0.5 mg/dL (ref 0.3–1.2)
Total Protein: 8.3 g/dL — ABNORMAL HIGH (ref 6.5–8.1)

## 2017-10-15 LAB — CBC WITH DIFFERENTIAL/PLATELET
Basophils Absolute: 0.1 10*3/uL (ref 0–0.1)
Basophils Relative: 1 %
EOS PCT: 0 %
Eosinophils Absolute: 0 10*3/uL (ref 0–0.7)
HEMATOCRIT: 41.9 % (ref 35.0–47.0)
HEMOGLOBIN: 14 g/dL (ref 12.0–16.0)
LYMPHS PCT: 11 %
Lymphs Abs: 2.2 10*3/uL (ref 1.0–3.6)
MCH: 27.6 pg (ref 26.0–34.0)
MCHC: 33.5 g/dL (ref 32.0–36.0)
MCV: 82.4 fL (ref 80.0–100.0)
Monocytes Absolute: 0.8 10*3/uL (ref 0.2–0.9)
Monocytes Relative: 4 %
Neutro Abs: 17 10*3/uL — ABNORMAL HIGH (ref 1.4–6.5)
Neutrophils Relative %: 84 %
PLATELETS: 422 10*3/uL (ref 150–440)
RBC: 5.09 MIL/uL (ref 3.80–5.20)
RDW: 13.4 % (ref 11.5–14.5)
WBC: 20.1 10*3/uL — AB (ref 3.6–11.0)

## 2017-10-15 MED ORDER — METOCLOPRAMIDE HCL 10 MG PO TABS: 10.0000 mg | ORAL_TABLET | Freq: Three times a day (TID) | ORAL | 1 refills | Status: DC | PRN

## 2017-10-15 MED ORDER — SODIUM CHLORIDE 0.9 % IV BOLUS (SEPSIS)
1000.0000 mL | INTRAVENOUS | Status: AC
  Administered 2017-10-15: 1000 mL via INTRAVENOUS

## 2017-10-15 MED ORDER — PROMETHAZINE HCL 25 MG/ML IJ SOLN: 25.0000 mg | Freq: Once | INTRAMUSCULAR | Status: DC

## 2017-10-15 MED ORDER — PROMETHAZINE HCL 25 MG/ML IJ SOLN
12.5000 mg | Freq: Once | INTRAMUSCULAR | Status: AC
  Administered 2017-10-15: 12.5 mg via INTRAVENOUS
  Filled 2017-10-15: qty 1

## 2017-10-15 MED ORDER — CEPHALEXIN 500 MG PO CAPS: 500.0000 mg | ORAL_CAPSULE | Freq: Three times a day (TID) | ORAL | 0 refills | Status: DC

## 2017-10-15 MED ORDER — METOCLOPRAMIDE HCL 5 MG/ML IJ SOLN
INTRAMUSCULAR | Status: AC
  Administered 2017-10-15: 10 mg via INTRAVENOUS
  Filled 2017-10-15: qty 2

## 2017-10-15 MED ORDER — METOCLOPRAMIDE HCL 5 MG/ML IJ SOLN
10.0000 mg | INTRAMUSCULAR | Status: AC
  Administered 2017-10-15: 10 mg via INTRAVENOUS

## 2017-10-15 MED ORDER — DEXTROSE 5 % IV SOLN
1.0000 g | INTRAVENOUS | Status: AC
  Administered 2017-10-15: 1 g via INTRAVENOUS

## 2017-10-15 MED ORDER — CEFTRIAXONE SODIUM IN DEXTROSE 20 MG/ML IV SOLN
INTRAVENOUS | Status: AC
  Administered 2017-10-15: 1 g
  Filled 2017-10-15: qty 50

## 2017-10-15 NOTE — ED Provider Notes (Signed)
[facility] Emergency Department Provider Note  ____________________________________________   First MD Initiated Contact with Patient 10/15/17 1254     (approximate)  I have reviewed the triage vital signs and the nursing notes.   HISTORY  Chief Complaint Emesis During Pregnancy    HPI Brenda Fox is a 27 y.o. female G4 P1 at approximately [redacted] weeks gestation who presents for evaluation of persistent nausea and vomiting.  She states that after she was in the emergency department for the same issue about 2 weeks ago, her symptoms did improve on oral Phenergan.  However over the last couple of days the Phenergan seems to stop working and she is again vomiting frequently.  She was dry heaving in triage but she was drinking some soda when I went into see her and she states that she no longer has any nausea.  She feels generally weak.  She denies fever/chills, chest pain, shortness of breath, and vaginal bleeding.  She is also not had any dysuria.  She states the symptoms are severe and what concerned her today was there was some bright red blood in 1 of the last episodes of emesis.  She is currently in no acute distress.   After the last visit she followed up with Dr. Leonides Schanz to establish prenatal care.  She is having issues with Medicaid, however, so she is not certain if she will go to the health department or back to Dr. Leonides Schanz.   History reviewed. No pertinent past medical history.  There are no active problems to display for this patient.   History reviewed. No pertinent surgical history.  Prior to Admission medications   Medication Sig Start Date End Date Taking? Authorizing Provider  cephALEXin (KEFLEX) 500 MG capsule Take 1 capsule (500 mg total) by mouth 3 (three) times daily. 10/15/17   Hinda Kehr, MD  metoCLOPramide (REGLAN) 10 MG tablet Take 1 tablet (10 mg total) by mouth every 8 (eight) hours as needed for nausea or vomiting. 10/15/17 10/15/18  Hinda Kehr, MD  ondansetron (ZOFRAN) 4 MG tablet Take 1 tablet (4 mg total) by mouth 3 (three) times daily as needed for nausea or vomiting. 09/24/17 09/24/18  Laban Emperor, PA-C  Prenatal Multivit-Min-Fe-FA (PRENATAL VITAMINS) 0.8 MG tablet Take 1 tablet by mouth daily. 09/24/17   Laban Emperor, PA-C  promethazine (PHENERGAN) 12.5 MG tablet Take 1 tablet (12.5 mg total) by mouth every 6 (six) hours as needed for nausea or vomiting. 09/28/17   Schuyler Amor, MD  promethazine (PHENERGAN) 25 MG suppository Place 1 suppository (25 mg total) rectally every 6 (six) hours as needed for nausea. 09/28/17 09/28/18  Schuyler Amor, MD  vitamin B-6 (PYRIDOXINE) 25 MG tablet Take 1 tablet every 6-8 hours as needed for nausea and vomiting. 09/24/17   Laban Emperor, PA-C    Allergies Patient has no known allergies.  History reviewed. No pertinent family history.  Social History Social History   Tobacco Use  . Smoking status: Never Smoker  . Smokeless tobacco: Never Used  Substance Use Topics  . Alcohol use: No  . Drug use: No    Review of Systems Constitutional: No fever/chills.  General fatigue. Eyes: No visual changes. ENT: Mild sore throat when she vomits. Cardiovascular: Denies chest pain. Respiratory: Denies shortness of breath. Gastrointestinal: Persistent N/V.  Mild abd pain when vomiting.  One episode of BRB in emesis. Genitourinary: No vaginal bleeding. Negative for dysuria. Musculoskeletal: Negative for neck pain.  Negative for back pain.  Integumentary: Negative for rash. Neurological: Negative for headaches, focal weakness or numbness.   ____________________________________________   PHYSICAL EXAM:  VITAL SIGNS: ED Triage Vitals  Enc Vitals Group     BP 10/15/17 1232 114/61     Pulse Rate 10/15/17 1232 90     Resp 10/15/17 1232 16     Temp 10/15/17 1232 99 F (37.2 C)     Temp Source 10/15/17 1232 Oral     SpO2 10/15/17 1232 100 %     Weight 10/15/17 1235 68 kg (150 lb)      Height 10/15/17 1235 1.651 m (5\' 5" )     Head Circumference --      Peak Flow --      Pain Score 10/15/17 1235 7     Pain Loc --      Pain Edu? --      Excl. in [place]? --     Constitutional: Alert and oriented. Well appearing and in no acute distress. Eyes: Conjunctivae are normal.  Head: Atraumatic. Nose: No congestion/rhinnorhea. Mouth/Throat: Mucous membranes are moist. Neck: No stridor.  No meningeal signs.   Cardiovascular: Normal rate, regular rhythm. Good peripheral circulation. Grossly normal heart sounds. Respiratory: Normal respiratory effort.  No retractions. Lungs CTAB. Gastrointestinal: Soft and nontender throughout. Musculoskeletal: No lower extremity tenderness nor edema. No gross deformities of extremities. Neurologic:  Normal speech and language. No gross focal neurologic deficits are appreciated.  Skin:  Skin is warm, dry and intact. No rash noted. Psychiatric: Mood and affect are normal. Speech and behavior are normal.  ____________________________________________   LABS (all labs ordered are listed, but only abnormal results are displayed)  Labs Reviewed  COMPREHENSIVE METABOLIC PANEL - Abnormal; Notable for the following components:      Result Value   CO2 21 (*)    Glucose, Bld 109 (*)    Total Protein 8.3 (*)    All other components within normal limits  CBC WITH DIFFERENTIAL/PLATELET - Abnormal; Notable for the following components:   WBC 20.1 (*)    Neutro Abs 17.0 (*)    All other components within normal limits  URINALYSIS, ROUTINE W REFLEX MICROSCOPIC - Abnormal; Notable for the following components:   Color, Urine YELLOW (*)    APPearance HAZY (*)    Ketones, ur 80 (*)    Nitrite POSITIVE (*)    Bacteria, UA MANY (*)    Squamous Epithelial / LPF 0-5 (*)    All other components within normal limits   ____________________________________________  EKG  None - EKG not ordered by ED  physician ____________________________________________  RADIOLOGY   No results found.  ____________________________________________   PROCEDURES  Critical Care performed: No   Procedure(s) performed:   Procedures   ____________________________________________   INITIAL IMPRESSION / ASSESSMENT AND PLAN / ED COURSE  As part of my medical decision making, I reviewed the following data within the [place] History obtained from family, Labs reviewed  and Notes from prior ED visits    Differential diagnosis includes, but is not limited to, hyperemesis gravidarum, acute infection such as urinary tract infection, viral  Gastroenteritis, other complication of pregnancy.  Patient has had no vaginal bleeding and she had a reassuring ultrasound about 3 weeks ago.  No indication to repeat at this time.  This is her fourth pregnancy and she reports that she has had a lot of problems with nausea and vomiting each time.  I will give her fluids and antiemetics and reassess.  She has not yet provided a urine sample.  If her symptoms remain refractory to treatment she may require observation in the hospital but at this point she is generally well-appearing and tolerating some Coca-Cola.  Lab work is notable for a moderate leukocytosis could be the result of the persistent vomiting she has no electrolyte abnormalities.  She has no tenderness to palpation of the abdomen which is also reassuring and no respiratory symptoms.   Clinical Course as of Oct 15 1710  Mon Oct 15, 2017  1347 Non-specific leukocytosis in the setting of hyperemesis WBC: (!) 20.1 [CF]  1513 Patient has not yet urinated and still feels "weak".  She has not vomited anymore and has tolerated oral intake.  I will give her a second liter of fluids and reassess.  [CF]  1608 Patient reporting nausea again, will try Phenergan 12.5 mg IV.  Patient getting up to urinate now.  Getting 2nd liter of fluids.  [CF]  1651  Ketones, ur: (!) 80 [CF]  1651 Nitrite: (!) POSITIVE [CF]  [id number] Patient says that she feels a little bit nauseated but much better than before.  She has gotten 2 L of fluids and her vital signs are stable.  She does have ketonuria consistent with dehydration but she has gotten 2 L of fluids and clinically says that she is feeling better.  Additionally  [CF]  0174  she has had some Coca-Cola and drink a cup of Sprite with no additional nausea or vomiting.I offered to call the OB/GYN and admit the patient to the hospital for intractable vomiting/hyperemesis, but she prefers to go home and her husband is comfortable with that plan as well.  I will give her a prescription for oral Reglan and encouraged close outpatient follow-up.  I gave my usual and customary return precautions.   [CF]    Clinical Course User Index [CF] Hinda Kehr, MD    ____________________________________________  FINAL CLINICAL IMPRESSION(S) / ED DIAGNOSES  Final diagnoses:  Hyperemesis gravidarum  Urinary tract infection without hematuria, site unspecified     MEDICATIONS GIVEN DURING THIS VISIT:  Medications  cefTRIAXone (ROCEPHIN) 1 g in dextrose 5 % 50 mL IVPB (1 g Intravenous New Bag/Given 10/15/17 1655)  metoCLOPramide (REGLAN) injection 10 mg (10 mg Intravenous Given 10/15/17 1243)  sodium chloride 0.9 % bolus 1,000 mL (0 mLs Intravenous Stopped 10/15/17 1411)  sodium chloride 0.9 % bolus 1,000 mL (0 mLs Intravenous Stopped 10/15/17 1657)  promethazine (PHENERGAN) injection 12.5 mg (12.5 mg Intravenous Given 10/15/17 1615)  cefTRIAXone (ROCEPHIN) 20 MG/ML IVPB 50 mL (1 g  New Bag/Given 10/15/17 1654)     ED Discharge Orders        Ordered    cephALEXin (KEFLEX) 500 MG capsule  3 times daily     10/15/17 1655    metoCLOPramide (REGLAN) 10 MG tablet  Every 8 hours PRN     10/15/17 1655       Note:  This document was prepared using Dragon voice recognition software and may include unintentional dictation  errors.    Hinda Kehr, MD 10/15/17 [phone]

## 2017-10-15 NOTE — ED Triage Notes (Addendum)
Patient reports she is [redacted]weeks pregnant and here for nausea/vomiting. Has not scheduled appointment with OBGYN or started pre-natal care. Ambulatory in triage. No respiratory distress noted. C/O abdominal pain X2 days. Denies abnormal d/c or bleeding

## 2017-10-15 NOTE — ED Notes (Addendum)
Pt alert, oriented, ambulatory. States [redacted] weeks pregnant. Started having N&V. Dry heaving during assessment. No other distress noted. Pt states 2nd pregnancy.

## 2017-10-15 NOTE — Discharge Instructions (Signed)
Today we treated you for hyperemesis gravidarum (severe vomiting during pregnancy), dehydration, and a urinary tract infection.  We offered to bring you into the hospital for your symptoms, but since you are feeling better after 2 L of fluid and nausea medicine, you prefer to go home tonight, which we feel is appropriate.  However we encourage you to follow-up as soon as possible with your OB/GYN.  These read through the included information that has some good tips and recommendations for you.  Try to drink plenty of clear fluids that have electrolytes such as Gatorade or even Pedialyte.  Use the prescribed nausea medicine as needed, but do not take both the new medication today and the one you had from before.  Return to the emergency department if you develop new or worsening symptoms that concern you.

## 2017-10-15 NOTE — ED Triage Notes (Signed)
FIRST NURSE NOTE-here for vomiting. [redacted] weeks pregnant. Has no seen oBGYN, only here.  Has phenergan, not working.

## 2017-10-21 ENCOUNTER — Other Ambulatory Visit: Payer: Self-pay

## 2017-10-21 ENCOUNTER — Observation Stay
Admission: EM | Admit: 2017-10-21 | Discharge: 2017-10-22 | Disposition: A | Discharge status: Home / Self Care | Payer: Medicaid Other | Attending: Obstetrics and Gynecology | Admitting: Obstetrics and Gynecology

## 2017-10-21 ENCOUNTER — Encounter: Payer: Self-pay | Admitting: Emergency Medicine

## 2017-10-21 DIAGNOSIS — Z79899 Other long term (current) drug therapy: Secondary | ICD-10-CM | POA: Diagnosis not present

## 2017-10-21 DIAGNOSIS — O3680X0 Pregnancy with inconclusive fetal viability, not applicable or unspecified: Secondary | ICD-10-CM

## 2017-10-21 DIAGNOSIS — Z3A01 Less than 8 weeks gestation of pregnancy: Secondary | ICD-10-CM | POA: Diagnosis not present

## 2017-10-21 DIAGNOSIS — Z8759 Personal history of other complications of pregnancy, childbirth and the puerperium: Secondary | ICD-10-CM

## 2017-10-21 DIAGNOSIS — O210 Mild hyperemesis gravidarum: Principal | ICD-10-CM | POA: Insufficient documentation

## 2017-10-21 DIAGNOSIS — R1110 Vomiting, unspecified: Secondary | ICD-10-CM

## 2017-10-21 LAB — COMPREHENSIVE METABOLIC PANEL
ALBUMIN: 4.2 g/dL (ref 3.5–5.0)
ALK PHOS: 48 U/L (ref 38–126)
ALT: 62 U/L — ABNORMAL HIGH (ref 14–54)
ANION GAP: 14 (ref 5–15)
AST: 39 U/L (ref 15–41)
BUN: 12 mg/dL (ref 6–20)
CHLORIDE: 99 mmol/L — AB (ref 101–111)
CO2: 22 mmol/L (ref 22–32)
Calcium: 9.6 mg/dL (ref 8.9–10.3)
Creatinine, Ser: 0.76 mg/dL (ref 0.44–1.00)
GFR calc Af Amer: >60 mL/min (ref >60)
GFR calc non Af Amer: >60 mL/min (ref >60)
GLUCOSE: 132 mg/dL — AB (ref 65–99)
Potassium: 3.5 mmol/L (ref 3.5–5.1)
SODIUM: 135 mmol/L (ref 135–145)
Total Bilirubin: 0.8 mg/dL (ref 0.3–1.2)
Total Protein: 8.5 g/dL — ABNORMAL HIGH (ref 6.5–8.1)

## 2017-10-21 LAB — URINALYSIS, COMPLETE (UACMP) WITH MICROSCOPIC
Bilirubin Urine: NEGATIVE
Glucose, UA: NEGATIVE mg/dL
HGB URINE DIPSTICK: NEGATIVE
KETONES UR: 80 mg/dL — AB
Leukocytes, UA: NEGATIVE
Nitrite: NEGATIVE
PROTEIN: NEGATIVE mg/dL
Specific Gravity, Urine: 1.018 (ref 1.005–1.030)
pH: 6 (ref 5.0–8.0)

## 2017-10-21 LAB — CBC
HEMATOCRIT: 40.8 % (ref 35.0–47.0)
HEMOGLOBIN: 13.6 g/dL (ref 12.0–16.0)
MCH: 27.5 pg (ref 26.0–34.0)
MCHC: 33.2 g/dL (ref 32.0–36.0)
MCV: 82.8 fL (ref 80.0–100.0)
Platelets: 400 10*3/uL (ref 150–440)
RBC: 4.93 MIL/uL (ref 3.80–5.20)
RDW: 13.4 % (ref 11.5–14.5)
WBC: 18.9 10*3/uL — ABNORMAL HIGH (ref 3.6–11.0)

## 2017-10-21 LAB — LIPASE, BLOOD: LIPASE: 45 U/L (ref 11–51)

## 2017-10-21 MED ORDER — KCL-LACTATED RINGERS-D5W 20 MEQ/L IV SOLN
INTRAVENOUS | Status: DC
  Administered 2017-10-21 – 2017-10-22 (×2): via INTRAVENOUS
  Filled 2017-10-21 (×6): qty 1000

## 2017-10-21 MED ORDER — ZOLPIDEM TARTRATE 5 MG PO TABS: 5.0000 mg | ORAL_TABLET | Freq: Every evening | ORAL | Status: DC | PRN

## 2017-10-21 MED ORDER — ACETAMINOPHEN 325 MG PO TABS: 650.0000 mg | ORAL_TABLET | ORAL | Status: DC | PRN

## 2017-10-21 MED ORDER — M.V.I. ADULT IV INJ
INJECTION | INTRAVENOUS | Status: DC
  Filled 2017-10-21: qty 10

## 2017-10-21 MED ORDER — CALCIUM CARBONATE ANTACID 500 MG PO CHEW: 2.0000 | CHEWABLE_TABLET | ORAL | Status: DC | PRN

## 2017-10-21 MED ORDER — SODIUM CHLORIDE 0.9 % IV SOLN
8.0000 mg | Freq: Three times a day (TID) | INTRAVENOUS | Status: DC
  Administered 2017-10-21 – 2017-10-22 (×2): 8 mg via INTRAVENOUS
  Filled 2017-10-21 (×6): qty 4

## 2017-10-21 MED ORDER — PROMETHAZINE HCL 25 MG/ML IJ SOLN
12.5000 mg | Freq: Once | INTRAMUSCULAR | Status: AC
  Administered 2017-10-21: 12.5 mg via INTRAVENOUS
  Filled 2017-10-21: qty 1

## 2017-10-21 MED ORDER — LACTATED RINGERS IV BOLUS (SEPSIS)
1000.0000 mL | Freq: Once | INTRAVENOUS | Status: AC
  Administered 2017-10-21: 1000 mL via INTRAVENOUS
  Filled 2017-10-21: qty 1000

## 2017-10-21 MED ORDER — LACTATED RINGERS IV BOLUS (SEPSIS): 2000.0000 mL | Freq: Once | INTRAVENOUS | Status: DC

## 2017-10-21 MED ORDER — SODIUM CHLORIDE 0.9 % IV SOLN
INTRAVENOUS | Status: DC
  Administered 2017-10-21 – 2017-10-22 (×3): via INTRAVENOUS
  Filled 2017-10-21 (×8): qty 1

## 2017-10-21 MED ORDER — SODIUM CHLORIDE 0.9 % IV BOLUS (SEPSIS)
1000.0000 mL | Freq: Once | INTRAVENOUS | Status: AC
  Administered 2017-10-21: 1000 mL via INTRAVENOUS

## 2017-10-21 MED ORDER — SODIUM CHLORIDE 0.9 % IV SOLN
25.0000 mg | INTRAVENOUS | Status: DC
  Filled 2017-10-21: qty 1

## 2017-10-21 MED ORDER — M.V.I. ADULT IV INJ
INTRAVENOUS | Status: DC
  Filled 2017-10-21: qty 10

## 2017-10-21 MED ORDER — DOCUSATE SODIUM 100 MG PO CAPS: 100.0000 mg | ORAL_CAPSULE | Freq: Every day | ORAL | Status: DC

## 2017-10-21 MED ORDER — THIAMINE HCL 100 MG/ML IJ SOLN
100.0000 mg | Freq: Every day | INTRAMUSCULAR | Status: DC
  Administered 2017-10-21: 100 mg via INTRAVENOUS
  Filled 2017-10-21 (×2): qty 1

## 2017-10-21 NOTE — ED Provider Notes (Signed)
.[facility] Emergency Department Provider Note  ____________________________________________   I have reviewed the triage vital signs and the nursing notes. Where available I have reviewed prior notes and, if possible and indicated, outside hospital notes.    HISTORY  Chief Complaint Emesis    HPI Brenda Fox is a 27 y.o. female who presents today complaining of continued vomiting.  Patient has been vomiting off and on for the last month.  She is in her early pregnancy.  She has had a known IUP by ultrasound which I have reviewed.  She denies any vaginal bleeding or significant abdominal pain, she has occasional cramping but mostly only in the physical active vomiting.  She has had trace blood in her vomit before but has not had that recently.  She vomited she states 40 times today.  She was seen here several days ago for the same thing and actually twice already this month.  She is followed by outpatient OB and has seen them.  She has had numerous interventions at home including Phenergan B6 PR Phenergan and Zofran none of which have helped.  She states that she is slightly less nauseated after the IV fluids and the Phenergan that she received on the way in.  She has not had any fever or chills or focal abdominal discomfort.  She denies any diarrhea she states she is having normal bowel States she did have similar symptoms with her past pregnancy, and she   History reviewed. No pertinent past medical history.  There are no active problems to display for this patient.   History reviewed. No pertinent surgical history.  Prior to Admission medications   Medication Sig Start Date End Date Taking? Authorizing Provider  cephALEXin (KEFLEX) 500 MG capsule Take 1 capsule (500 mg total) by mouth 3 (three) times daily. 10/15/17   Hinda Kehr, MD  metoCLOPramide (REGLAN) 10 MG tablet Take 1 tablet (10 mg total) by mouth every 8 (eight) hours as needed for nausea or  vomiting. 10/15/17 10/15/18  Hinda Kehr, MD  ondansetron (ZOFRAN) 4 MG tablet Take 1 tablet (4 mg total) by mouth 3 (three) times daily as needed for nausea or vomiting. 09/24/17 09/24/18  Laban Emperor, PA-C  Prenatal Multivit-Min-Fe-FA (PRENATAL VITAMINS) 0.8 MG tablet Take 1 tablet by mouth daily. 09/24/17   Laban Emperor, PA-C  promethazine (PHENERGAN) 12.5 MG tablet Take 1 tablet (12.5 mg total) by mouth every 6 (six) hours as needed for nausea or vomiting. 09/28/17   Schuyler Amor, MD  promethazine (PHENERGAN) 25 MG suppository Place 1 suppository (25 mg total) rectally every 6 (six) hours as needed for nausea. 09/28/17 09/28/18  Schuyler Amor, MD  vitamin B-6 (PYRIDOXINE) 25 MG tablet Take 1 tablet every 6-8 hours as needed for nausea and vomiting. 09/24/17   Laban Emperor, PA-C    Allergies Patient has no known allergies.  No family history on file.  Social History Social History   Tobacco Use  . Smoking status: Never Smoker  . Smokeless tobacco: Never Used  Substance Use Topics  . Alcohol use: No  . Drug use: No    Review of Systems Constitutional: No fever/chills Eyes: No visual changes. ENT: No sore throat. No stiff neck no neck pain Cardiovascular: Denies chest pain. Respiratory: Denies shortness of breath. Gastrointestinal:   +vomiting.  No diarrhea.  No constipation. Genitourinary: Negative for dysuria. Musculoskeletal: Negative lower extremity swelling Skin: Negative for rash. Neurological: Negative for severe headaches, focal weakness or numbness.  ____________________________________________   PHYSICAL EXAM:  VITAL SIGNS: ED Triage Vitals  Enc Vitals Group     BP 10/21/17 1314 128/80     Pulse Rate 10/21/17 1314 80     Resp 10/21/17 1314 16     Temp 10/21/17 1314 98.6 F (37 C)     Temp Source 10/21/17 1314 Oral     SpO2 10/21/17 1314 100 %     Weight 10/21/17 1315 145 lb (65.8 kg)     Height 10/21/17 1315 5\' 5"  (1.651 m)     Head Circumference  --      Peak Flow --      Pain Score 10/21/17 1315 7     Pain Loc --      Pain Edu? --      Excl. in [place]? --     Constitutional: Alert and oriented. Well appearing and in no acute distress. Eyes: Conjunctivae are normal Head: Atraumatic HEENT: No congestion/rhinnorhea. Mucous membranes are dry.  Oropharynx non-erythematous Neck:   Nontender with no meningismus, no masses, no stridor Cardiovascular: Normal rate, regular rhythm. Grossly normal heart sounds.  Good peripheral circulation. Respiratory: Normal respiratory effort.  No retractions. Lungs CTAB. Abdominal: Soft and nontender. No distention. No guarding no rebound Back:  There is no focal tenderness or step off.  there is no midline tenderness there are no lesions noted. there is no CVA tenderness Musculoskeletal: No lower extremity tenderness, no upper extremity tenderness. No joint effusions, no DVT signs strong distal pulses no edema Neurologic:  Normal speech and language. No gross focal neurologic deficits are appreciated.  Skin:  Skin is warm, dry and intact. No rash noted. Psychiatric: Mood and affect are normal. Speech and behavior are normal.  ____________________________________________   LABS (all labs ordered are listed, but only abnormal results are displayed)  Labs Reviewed  COMPREHENSIVE METABOLIC PANEL - Abnormal; Notable for the following components:      Result Value   Chloride 99 (*)    Glucose, Bld 132 (*)    Total Protein 8.5 (*)    ALT 62 (*)    All other components within normal limits  CBC - Abnormal; Notable for the following components:   WBC 18.9 (*)    All other components within normal limits  LIPASE, BLOOD  URINALYSIS, COMPLETE (UACMP) WITH MICROSCOPIC  POC URINE PREG, ED    Pertinent labs  results that were available during my care of the patient were reviewed by me and considered in my medical decision making (see chart for  details). ____________________________________________  EKG  I personally interpreted any EKGs ordered by me or triage  ____________________________________________  RADIOLOGY  Pertinent labs & imaging results that were available during my care of the patient were reviewed by me and considered in my medical decision making (see chart for details). If possible, patient and/or family made aware of any abnormal findings.  No results found. ____________________________________________    PROCEDURES  Procedure(s) performed: None  Procedures  Critical Care performed: None  ____________________________________________   INITIAL IMPRESSION / ASSESSMENT AND PLAN / ED COURSE  Pertinent labs & imaging results that were available during my care of the patient were reviewed by me and considered in my medical decision making (see chart for details).  Patient here with hyperemesis gravidarum for 1 month duration at least, low suspicion for other intra-abdominal pathology such as ectopic pregnancy, gallbladder disease etc.  Nothing to suggest appendicitis on her exam.  We are giving her IV fluid here  but I am concerned the patient had 80+ ketones in her urine during her prior visit and has not been able to tolerate p.o. medication very well at home.  I did discuss with Dr. Leafy Ro, very much appreciate the consult, Dr. Leafy Ro does agree and will admit the patient for further workup and observation.  We are giving her 2 more liters of lactated Ringer here.  Patient has not yet given [place] a urine sample.  She does not however appear to be toxic.    ____________________________________________   FINAL CLINICAL IMPRESSION(S) / ED DIAGNOSES  Final diagnoses:  Hyperemesis      This chart was dictated using voice recognition software.  Despite best efforts to proofread,  errors can occur which can change meaning.      Schuyler Amor, MD 10/21/17 [phone]

## 2017-10-21 NOTE — ED Notes (Signed)
Pt is [redacted] weeks pregnant. 4th pregnancy. States started vomiting again on Friday. Denies diarrhea.

## 2017-10-21 NOTE — ED Notes (Signed)
Pt assisted to restroom. Pt c/o dizzy when returning to bed. Pt blood pressure 121/74.

## 2017-10-21 NOTE — ED Notes (Signed)
Advices pt not to eat or drink anything, pt took a soda and drank after RN advised her not to eat or drink anything to help with nausea, soon after pt drank her soda she started to vomit what she ingested.

## 2017-10-21 NOTE — ED Notes (Signed)
Discussed with Dr. Kerman Passey about pt's presentation, received verbal order to administered 1L NS via IV and 12.5mg  of phenergan via IV

## 2017-10-21 NOTE — ED Triage Notes (Signed)
Pt reports she is [redacted] weeks pregnant not being able to eat or drink for past 3 days. Denies any other symptom at present

## 2017-10-21 NOTE — H&P (Signed)
[facility]TEPARTUM ADMISSION HISTORY AND PHYSICAL NOTE   History of Present Illness: Brenda Fox is a 27 y.o. G4P1021 at [redacted]w[redacted]d by early u/a admitted with nausea and vomiting of pregnancy. Present in prior pregnancy as well. Vomited 40x yesterday, is dizzy and has not been urinating often.  Has been seen in the ER 4 times this month for n/v in early pregnancy.  U/s on 09/24/17 found her to be [redacted]w[redacted]d with EDC of 05/16/18   Patient Active Problem List   Diagnosis Date Noted  . Hyperemesis arising during pregnancy 10/21/2017    History reviewed. No pertinent past medical history.  History reviewed. No pertinent surgical history.  OB History  Gravida Para Term Preterm AB Living  1            SAB TAB Ectopic Multiple Live Births               # Outcome Date GA Lbr Len/2nd Weight Sex Delivery Anes PTL Lv  1 Current               Social History   Socioeconomic History  . Marital status: Single    Spouse name: None  . Number of children: None  . Years of education: None  . Highest education level: None  Social Needs  . Financial resource strain: None  . Food insecurity - worry: None  . Food insecurity - inability: None  . Transportation needs - medical: None  . Transportation needs - non-medical: None  Occupational History  . None  Tobacco Use  . Smoking status: Never Smoker  . Smokeless tobacco: Never Used  Substance and Sexual Activity  . Alcohol use: No  . Drug use: No  . Sexual activity: None  Other Topics Concern  . None  Social History Narrative  . None    No family history on file.  No Known Allergies  Medications Prior to Admission  Medication Sig Dispense Refill Last Dose  . cephALEXin (KEFLEX) 500 MG capsule Take 1 capsule (500 mg total) by mouth 3 (three) times daily. 30 capsule 0 10/21/2017 at N/A  . metoCLOPramide (REGLAN) 10 MG tablet Take 1 tablet (10 mg total) by mouth every 8 (eight) hours as needed for nausea or vomiting. 30 tablet 1 PRN at  PRN  . ondansetron (ZOFRAN) 4 MG tablet Take 1 tablet (4 mg total) by mouth 3 (three) times daily as needed for nausea or vomiting. 6 tablet 0 PRN at PRN  . Prenatal Multivit-Min-Fe-FA (PRENATAL VITAMINS) 0.8 MG tablet Take 1 tablet by mouth daily. 60 tablet 0 10/21/2017 at AM  . promethazine (PHENERGAN) 12.5 MG tablet Take 1 tablet (12.5 mg total) by mouth every 6 (six) hours as needed for nausea or vomiting. 10 tablet 0 PRN at PRN  . promethazine (PHENERGAN) 25 MG suppository Place 1 suppository (25 mg total) rectally every 6 (six) hours as needed for nausea. 12 suppository 1 PRN at PRN  . vitamin B-6 (PYRIDOXINE) 25 MG tablet Take 1 tablet every 6-8 hours as needed for nausea and vomiting. 24 tablet 0 10/21/2017 at N/A    Review of Systems - Negative except as above  Vitals:  BP (!) 102/48 (BP Location: Right Arm)   Pulse 75   Temp 97.8 F (36.6 C) (Oral)   Resp 17   Ht 5\' 5"  (1.651 m)   Wt 65.8 kg (145 lb)   LMP 08/24/2017   SpO2 100%   BMI 24.13 kg/m  Physical Examination: CONSTITUTIONAL: Well-developed,  well-nourished female in no acute distress.  HENT:  Normocephalic, atraumatic, External right and left ear normal. Oropharynx is clear and moist EYES: Conjunctivae and EOM are normal. Pupils are equal, round, and reactive to light. No scleral icterus.  NECK: Normal range of motion, supple, no masses SKIN: Skin is warm and dry. No rash noted. Not diaphoretic. No erythema. No pallor. Elim: Alert and oriented to person, place, and time. Normal reflexes, muscle tone coordination. No cranial nerve deficit noted. PSYCHIATRIC: Normal mood and affect. Normal behavior. Normal judgment and thought content. CARDIOVASCULAR: Normal heart rate noted, regular rhythm RESPIRATORY: Effort and breath sounds normal, no problems with respiration noted ABDOMEN: Soft, nontender, nondistended, gravid. MUSCULOSKELETAL: Normal range of motion. No edema and no tenderness. 2+ distal pulses.   Labs:   Results for orders placed or performed during the hospital encounter of 10/21/17 (from the past 24 hour(s))  Lipase, blood   Collection Time: 10/21/17  1:17 PM  Result Value Ref Range   Lipase 45 11 - 51 U/L  Comprehensive metabolic panel   Collection Time: 10/21/17  1:17 PM  Result Value Ref Range   Sodium 135 135 - 145 mmol/L   Potassium 3.5 3.5 - 5.1 mmol/L   Chloride 99 (L) 101 - 111 mmol/L   CO2 22 22 - 32 mmol/L   Glucose, Bld 132 (H) 65 - 99 mg/dL   BUN 12 6 - 20 mg/dL   Creatinine, Ser 0.76 0.44 - 1.00 mg/dL   Calcium 9.6 8.9 - 10.3 mg/dL   Total Protein 8.5 (H) 6.5 - 8.1 g/dL   Albumin 4.2 3.5 - 5.0 g/dL   AST 39 15 - 41 U/L   ALT 62 (H) 14 - 54 U/L   Alkaline Phosphatase 48 38 - 126 U/L   Total Bilirubin 0.8 0.3 - 1.2 mg/dL   GFR calc non Af Amer >60 >60 mL/min   GFR calc Af Amer >60 >60 mL/min   Anion gap 14 5 - 15  CBC   Collection Time: 10/21/17  1:17 PM  Result Value Ref Range   WBC 18.9 (H) 3.6 - 11.0 K/uL   RBC 4.93 3.80 - 5.20 MIL/uL   Hemoglobin 13.6 12.0 - 16.0 g/dL   HCT 40.8 35.0 - 47.0 %   MCV 82.8 80.0 - 100.0 fL   MCH 27.5 26.0 - 34.0 pg   MCHC 33.2 32.0 - 36.0 g/dL   RDW 13.4 11.5 - 14.5 %   Platelets 400 150 - 440 K/uL  Urinalysis, Complete w Microscopic   Collection Time: 10/21/17  9:32 PM  Result Value Ref Range   Color, Urine AMBER (A) YELLOW   APPearance HAZY (A) CLEAR   Specific Gravity, Urine 1.018 1.005 - 1.030   pH 6.0 5.0 - 8.0   Glucose, UA NEGATIVE NEGATIVE mg/dL   Hgb urine dipstick NEGATIVE NEGATIVE   Bilirubin Urine NEGATIVE NEGATIVE   Ketones, ur 80 (A) NEGATIVE mg/dL   Protein, ur NEGATIVE NEGATIVE mg/dL   Nitrite NEGATIVE NEGATIVE   Leukocytes, UA NEGATIVE NEGATIVE   RBC / HPF 0-5 0 - 5 RBC/hpf   WBC, UA 0-5 0 - 5 WBC/hpf   Bacteria, UA RARE (A) NONE SEEN   Squamous Epithelial / LPF 0-5 (A) NONE SEEN   Mucus PRESENT   Comprehensive metabolic panel   Collection Time: 10/22/17  5:24 AM  Result Value Ref Range    Sodium 140 135 - 145 mmol/L   Potassium 3.6 3.5 - 5.1 mmol/L  Chloride 109 101 - 111 mmol/L   CO2 23 22 - 32 mmol/L   Glucose, Bld 88 65 - 99 mg/dL   BUN 10 6 - 20 mg/dL   Creatinine, Ser 0.56 0.44 - 1.00 mg/dL   Calcium 8.0 (L) 8.9 - 10.3 mg/dL   Total Protein 6.0 (L) 6.5 - 8.1 g/dL   Albumin 3.0 (L) 3.5 - 5.0 g/dL   AST 29 15 - 41 U/L   ALT 48 14 - 54 U/L   Alkaline Phosphatase 35 (L) 38 - 126 U/L   Total Bilirubin 0.5 0.3 - 1.2 mg/dL   GFR calc non Af Amer >60 >60 mL/min   GFR calc Af Amer >60 >60 mL/min   Anion gap 8 5 - 15  CBC   Collection Time: 10/22/17  5:24 AM  Result Value Ref Range   WBC 9.0 3.6 - 11.0 K/uL   RBC 3.77 (L) 3.80 - 5.20 MIL/uL   Hemoglobin 10.7 (L) 12.0 - 16.0 g/dL   HCT 31.4 (L) 35.0 - 47.0 %   MCV 83.1 80.0 - 100.0 fL   MCH 28.3 26.0 - 34.0 pg   MCHC 34.0 32.0 - 36.0 g/dL   RDW 13.5 11.5 - 14.5 %   Platelets 298 150 - 440 K/uL    Imaging Studies: US Ob Comp Less 14 Wks  Result Date: 09/24/2017 CLINICAL DATA:  Pain, early pregnancy. EXAM: OBSTETRIC <14 WK [place] AND TRANSVAGINAL OB US TECHNIQUE: Both transabdominal and transvaginal ultrasound examinations were performed for complete evaluation of the gestation as well as the maternal uterus, adnexal regions, and pelvic cul-de-sac. Transvaginal technique was performed to assess early pregnancy. COMPARISON:  None. FINDINGS: Intrauterine gestational sac: Visualized. Yolk sac:  Visualized. Embryo:  Visualized. Cardiac Activity: Visualized. Heart Rate: 126  bpm CRL:  6.6  mm   6 w   4 d                  [place] EDC: 05/16/2018 Subchorionic hemorrhage:  None visualized. Maternal uterus/adnexae: Ovaries are visualized. Corpus luteum cyst on the left. There is also a septated cystic structure in the left ovary, measuring 3.1 x 1.6 x 2.2 cm. Small free fluid. IMPRESSION: 1. Single living intrauterine pregnancy with gestational age [redacted] weeks 4 days and estimated date of confinement of 05/16/2018. 2. Possible hemorrhagic cyst in  the left ovary. Continued attention on followup exams is warranted as malignancy cannot be definitively excluded. 3. Small free fluid. Electronically Signed   By: Lorin Picket M.D.   On: 09/24/2017 15:40   US Ob Transvaginal  Result Date: 09/24/2017 CLINICAL DATA:  Pain, early pregnancy. EXAM: OBSTETRIC <14 WK [place] AND TRANSVAGINAL OB US TECHNIQUE: Both transabdominal and transvaginal ultrasound examinations were performed for complete evaluation of the gestation as well as the maternal uterus, adnexal regions, and pelvic cul-de-sac. Transvaginal technique was performed to assess early pregnancy. COMPARISON:  None. FINDINGS: Intrauterine gestational sac: Visualized. Yolk sac:  Visualized. Embryo:  Visualized. Cardiac Activity: Visualized. Heart Rate: 126  bpm CRL:  6.6  mm   6 w   4 d                  [place] EDC: 05/16/2018 Subchorionic hemorrhage:  None visualized. Maternal uterus/adnexae: Ovaries are visualized. Corpus luteum cyst on the left. There is also a septated cystic structure in the left ovary, measuring 3.1 x 1.6 x 2.2 cm. Small free fluid. IMPRESSION: 1. Single living intrauterine pregnancy with gestational age [redacted] weeks 25  days and estimated date of confinement of 05/16/2018. 2. Possible hemorrhagic cyst in the left ovary. Continued attention on followup exams is warranted as malignancy cannot be definitively excluded. 3. Small free fluid. Electronically Signed   By: Lorin Picket M.D.   On: 09/24/2017 15:40     Assessment and Plan: Patient Active Problem List   Diagnosis Date Noted  . Hyperemesis arising during pregnancy 10/21/2017   Admit to Antenatal  Scheduled antiemetics (zofran and phenergan) NPO Strict Is and Os Will check fetal heart tones  Benjaman Kindler 10/22/17

## 2017-10-22 LAB — COMPREHENSIVE METABOLIC PANEL
ALT: 48 U/L (ref 14–54)
ANION GAP: 8 (ref 5–15)
AST: 29 U/L (ref 15–41)
Albumin: 3 g/dL — ABNORMAL LOW (ref 3.5–5.0)
Alkaline Phosphatase: 35 U/L — ABNORMAL LOW (ref 38–126)
BUN: 10 mg/dL (ref 6–20)
CALCIUM: 8 mg/dL — AB (ref 8.9–10.3)
CHLORIDE: 109 mmol/L (ref 101–111)
CO2: 23 mmol/L (ref 22–32)
Creatinine, Ser: 0.56 mg/dL (ref 0.44–1.00)
GFR calc non Af Amer: >60 mL/min (ref >60)
Glucose, Bld: 88 mg/dL (ref 65–99)
Potassium: 3.6 mmol/L (ref 3.5–5.1)
SODIUM: 140 mmol/L (ref 135–145)
Total Bilirubin: 0.5 mg/dL (ref 0.3–1.2)
Total Protein: 6 g/dL — ABNORMAL LOW (ref 6.5–8.1)

## 2017-10-22 LAB — CBC
HCT: 31.4 % — ABNORMAL LOW (ref 35.0–47.0)
HEMOGLOBIN: 10.7 g/dL — AB (ref 12.0–16.0)
MCH: 28.3 pg (ref 26.0–34.0)
MCHC: 34 g/dL (ref 32.0–36.0)
MCV: 83.1 fL (ref 80.0–100.0)
Platelets: 298 10*3/uL (ref 150–440)
RBC: 3.77 MIL/uL — AB (ref 3.80–5.20)
RDW: 13.5 % (ref 11.5–14.5)
WBC: 9 10*3/uL (ref 3.6–11.0)

## 2017-10-22 LAB — URINALYSIS, ROUTINE W REFLEX MICROSCOPIC
Bilirubin Urine: NEGATIVE
Glucose, UA: NEGATIVE mg/dL
HGB URINE DIPSTICK: NEGATIVE
Ketones, ur: 80 mg/dL — AB
LEUKOCYTES UA: NEGATIVE
Nitrite: NEGATIVE
Protein, ur: NEGATIVE mg/dL
SPECIFIC GRAVITY, URINE: 1.019 (ref 1.005–1.030)
pH: 6 (ref 5.0–8.0)

## 2017-10-22 MED ORDER — ONDANSETRON 4 MG PO TBDP: 4.0000 mg | ORAL_TABLET | Freq: Four times a day (QID) | ORAL | 0 refills | Status: DC | PRN

## 2017-10-22 MED ORDER — SODIUM CHLORIDE 0.9 % IV SOLN
INTRAVENOUS | Status: DC
  Filled 2017-10-22 (×8): qty 0.5

## 2017-10-22 MED ORDER — ENSURE ENLIVE PO LIQD: 237.0000 mL | Freq: Two times a day (BID) | ORAL | Status: DC

## 2017-10-22 MED ORDER — ONDANSETRON 4 MG PO TBDP
4.0000 mg | ORAL_TABLET | Freq: Four times a day (QID) | ORAL | Status: DC | PRN
  Administered 2017-10-22 (×2): 4 mg via ORAL
  Filled 2017-10-22 (×2): qty 1

## 2017-10-22 MED ORDER — PROCHLORPERAZINE MALEATE 10 MG PO TABS
10.0000 mg | ORAL_TABLET | Freq: Four times a day (QID) | ORAL | Status: DC | PRN
  Filled 2017-10-22: qty 1

## 2017-10-22 MED ORDER — DOCUSATE SODIUM 100 MG PO CAPS: 100.0000 mg | ORAL_CAPSULE | Freq: Every day | ORAL | 0 refills | Status: DC

## 2017-10-22 NOTE — Progress Notes (Signed)
Discharged to home.  Ambulatory to car

## 2017-10-22 NOTE — Discharge Instructions (Signed)

## 2017-10-22 NOTE — Progress Notes (Signed)
Initial Nutrition Assessment  DOCUMENTATION CODES:   Not applicable  INTERVENTION:  Provide Ensure Enlive po BID, each supplement provides 350 kcal and 20 grams of protein. Patient prefers strawberry.  Reviewed "Morning Sickness Nutrition Therapy" handout with patient. Encouraged small, frequent meals with bland food. Encouraged adequate intake of protein. Discussed strategies for managing nausea.  Recommend daily prenatal vitamin and vitamin B6 10-50 mg Q8hrs.  NUTRITION DIAGNOSIS:   Inadequate oral intake related to acute illness(hyperemesis) as evidenced by per patient/family report.  GOAL:   Patient will meet greater than or equal to 90% of their needs  MONITOR:   PO intake, Supplement acceptance, Labs, Weight trends, Diet advancement, I & O's  REASON FOR ASSESSMENT:   Other (Comment)([place] report - 20# wt loss over 1 month)    ASSESSMENT:   27 year old female at 54w5dadmitted with nausea and vomiting of pregnancy, which was present in prior pregnancy as well. Of note, patient has been seen in ER 5 times this month for N/V.   Met with patient at bedside. She reports she has been experiencing constant N/V for the past 3 weeks since she found out she was pregnant. She reports the emesis is constant with every meal and drink she has. She is still trying to eat 3 meals per day and a snack at bedtime. She reports she also experienced the hyperemesis in her previous pregnancy. Reviewed handout with patient. She is requesting a strawberry protein shake. Patient reports she has her Prenatal MVI and vitamin B6 at home.  She reports her pre-pregnancy UBW was 165 lbs and she has lost 20 lbs over the past month. Does not align with weight history in chart. Per chart patient was 150 lbs pre-pregnancy. RD obtained bed scale weight of 155 lbs today.  Meal Completion: 75-100%  Medications reviewed and include: Colace, thiamine 100 mg daily IV, D5-LR with KCl 20 mEq/L at 110 mL/hr (132  grams dextrose, 449 kcal daily), Adult MVI IV, Zofran, Phenergan IV.  Labs reviewed and none pertinent.  Patient does not meet criteria for malnutrition at this time.  Discussed with RN.  NUTRITION - FOCUSED PHYSICAL EXAM:    Most Recent Value  Orbital Region  No depletion  Upper Arm Region  No depletion  Thoracic and Lumbar Region  No depletion  Buccal Region  No depletion  Temple Region  No depletion  Clavicle Bone Region  No depletion  Clavicle and Acromion Bone Region  No depletion  Scapular Bone Region  No depletion  Dorsal Hand  No depletion  Patellar Region  No depletion  Anterior Thigh Region  No depletion  Posterior Calf Region  No depletion  Edema (RD Assessment)  None  Hair  Reviewed  Eyes  Reviewed  Mouth  Reviewed  Skin  Reviewed  Nails  Reviewed     Diet Order:  DIET SOFT Room service appropriate? Yes; Fluid consistency: Thin  EDUCATION NEEDS:   Education needs have been addressed  Skin:  Skin Assessment: Reviewed RN Assessment  Last BM:  Unknown  Height:   Ht Readings from Last 1 Encounters:  10/21/17 5' 5"  (1.651 m)    Weight:   Wt Readings from Last 1 Encounters:  10/22/17 154 lb 15.7 oz (70.3 kg)    Ideal Body Weight:  56.8 kg  BMI:  Body mass index is 25.79 kg/m.  Estimated Nutritional Needs:   Kcal:  1580 (MSJ x 1.1)  Protein:  77-84 grams (1.1-1.2 grams/kg)  Fluid:  2.5  L/day (35 mL/kg)  Willey Blade, MS, RD, LDN Office: [phone] Pager: [phone] After Hours/Weekend Pager: [phone]

## 2017-10-22 NOTE — Progress Notes (Signed)
Dc to home.  To car with spouse.  No further N/V today.  Discussed importance of keeping Zofran in system

## 2017-10-22 NOTE — Progress Notes (Signed)
BS Ultrasound per Dr. Leafy Ro

## 2017-10-22 NOTE — Discharge Summary (Signed)
Patient ID: Brenda Fox MRN: [id number] DOB/AGE: [birth year] 27 y.o.  Admit date: 10/21/2017 Discharge date: 10/22/2017  Admission Diagnoses: Hyperemesis  Ultrasound scan to confirm fetal viability with history of miscarriage - Plan: CANCELED: US OB Limited, CANCELED: US OB Limited  Discharge Diagnoses: Hyperemesis  Prenatal Procedures: none  Consults: Neonatology, Maternal Fetal Medicine  Significant Diagnostic Studies:  Results for orders placed or performed during the hospital encounter of 10/21/17 (from the past 168 hour(s))  Lipase, blood   Collection Time: 10/21/17  1:17 PM  Result Value Ref Range   Lipase 45 11 - 51 U/L  Comprehensive metabolic panel   Collection Time: 10/21/17  1:17 PM  Result Value Ref Range   Sodium 135 135 - 145 mmol/L   Potassium 3.5 3.5 - 5.1 mmol/L   Chloride 99 (L) 101 - 111 mmol/L   CO2 22 22 - 32 mmol/L   Glucose, Bld 132 (H) 65 - 99 mg/dL   BUN 12 6 - 20 mg/dL   Creatinine, Ser 0.76 0.44 - 1.00 mg/dL   Calcium 9.6 8.9 - 10.3 mg/dL   Total Protein 8.5 (H) 6.5 - 8.1 g/dL   Albumin 4.2 3.5 - 5.0 g/dL   AST 39 15 - 41 U/L   ALT 62 (H) 14 - 54 U/L   Alkaline Phosphatase 48 38 - 126 U/L   Total Bilirubin 0.8 0.3 - 1.2 mg/dL   GFR calc non Af Amer >60 >60 mL/min   GFR calc Af Amer >60 >60 mL/min   Anion gap 14 5 - 15  CBC   Collection Time: 10/21/17  1:17 PM  Result Value Ref Range   WBC 18.9 (H) 3.6 - 11.0 K/uL   RBC 4.93 3.80 - 5.20 MIL/uL   Hemoglobin 13.6 12.0 - 16.0 g/dL   HCT 40.8 35.0 - 47.0 %   MCV 82.8 80.0 - 100.0 fL   MCH 27.5 26.0 - 34.0 pg   MCHC 33.2 32.0 - 36.0 g/dL   RDW 13.4 11.5 - 14.5 %   Platelets 400 150 - 440 K/uL  Urinalysis, Complete w Microscopic   Collection Time: 10/21/17  9:32 PM  Result Value Ref Range   Color, Urine AMBER (A) YELLOW   APPearance HAZY (A) CLEAR   Specific Gravity, Urine 1.018 1.005 - 1.030   pH 6.0 5.0 - 8.0   Glucose, UA NEGATIVE NEGATIVE mg/dL   Hgb urine dipstick NEGATIVE NEGATIVE   Bilirubin Urine NEGATIVE NEGATIVE   Ketones, ur 80 (A) NEGATIVE mg/dL   Protein, ur NEGATIVE NEGATIVE mg/dL   Nitrite NEGATIVE NEGATIVE   Leukocytes, UA NEGATIVE NEGATIVE   RBC / HPF 0-5 0 - 5 RBC/hpf   WBC, UA 0-5 0 - 5 WBC/hpf   Bacteria, UA RARE (A) NONE SEEN   Squamous Epithelial / LPF 0-5 (A) NONE SEEN   Mucus PRESENT   Urinalysis, Routine w reflex microscopic   Collection Time: 10/21/17  9:32 PM  Result Value Ref Range   Color, Urine AMBER (A) YELLOW   APPearance CLEAR (A) CLEAR   Specific Gravity, Urine 1.019 1.005 - 1.030   pH 6.0 5.0 - 8.0   Glucose, UA NEGATIVE NEGATIVE mg/dL   Hgb urine dipstick NEGATIVE NEGATIVE   Bilirubin Urine NEGATIVE NEGATIVE   Ketones, ur 80 (A) NEGATIVE mg/dL   Protein, ur NEGATIVE NEGATIVE mg/dL   Nitrite NEGATIVE NEGATIVE   Leukocytes, UA NEGATIVE NEGATIVE  Comprehensive metabolic panel   Collection Time: 10/22/17  5:24 AM  Result Value Ref  Range   Sodium 140 135 - 145 mmol/L   Potassium 3.6 3.5 - 5.1 mmol/L   Chloride 109 101 - 111 mmol/L   CO2 23 22 - 32 mmol/L   Glucose, Bld 88 65 - 99 mg/dL   BUN 10 6 - 20 mg/dL   Creatinine, Ser 0.56 0.44 - 1.00 mg/dL   Calcium 8.0 (L) 8.9 - 10.3 mg/dL   Total Protein 6.0 (L) 6.5 - 8.1 g/dL   Albumin 3.0 (L) 3.5 - 5.0 g/dL   AST 29 15 - 41 U/L   ALT 48 14 - 54 U/L   Alkaline Phosphatase 35 (L) 38 - 126 U/L   Total Bilirubin 0.5 0.3 - 1.2 mg/dL   GFR calc non Af Amer >60 >60 mL/min   GFR calc Af Amer >60 >60 mL/min   Anion gap 8 5 - 15  CBC   Collection Time: 10/22/17  5:24 AM  Result Value Ref Range   WBC 9.0 3.6 - 11.0 K/uL   RBC 3.77 (L) 3.80 - 5.20 MIL/uL   Hemoglobin 10.7 (L) 12.0 - 16.0 g/dL   HCT 31.4 (L) 35.0 - 47.0 %   MCV 83.1 80.0 - 100.0 fL   MCH 28.3 26.0 - 34.0 pg   MCHC 34.0 32.0 - 36.0 g/dL   RDW 13.5 11.5 - 14.5 %   Platelets 298 150 - 440 K/uL  Results for orders placed or performed during the hospital encounter of 10/15/17 (from the past 168 hour(s))  Urinalysis,  Routine w reflex microscopic   Collection Time: 10/15/17  4:10 PM  Result Value Ref Range   Color, Urine YELLOW (A) YELLOW   APPearance HAZY (A) CLEAR   Specific Gravity, Urine 1.023 1.005 - 1.030   pH 5.0 5.0 - 8.0   Glucose, UA NEGATIVE NEGATIVE mg/dL   Hgb urine dipstick NEGATIVE NEGATIVE   Bilirubin Urine NEGATIVE NEGATIVE   Ketones, ur 80 (A) NEGATIVE mg/dL   Protein, ur NEGATIVE NEGATIVE mg/dL   Nitrite POSITIVE (A) NEGATIVE   Leukocytes, UA NEGATIVE NEGATIVE   RBC / HPF 0-5 0 - 5 RBC/hpf   WBC, UA 0-5 0 - 5 WBC/hpf   Bacteria, UA MANY (A) NONE SEEN   Squamous Epithelial / LPF 0-5 (A) NONE SEEN   Mucus PRESENT     Treatments: IV hydration and anti-emetics  Hospital Course:  This is a 27 y.o. G1P0 with IUP at [redacted]w[redacted]d;  admitted for Hyperemesis. She was deemed stable for discharge to home with outpatient follow up.  Discharge Physical Exam:  BP 123/72 (BP Location: Right Arm)   Pulse 86   Temp 98 F (36.7 C) (Oral)   Resp 18   Ht 5\' 5"  (1.651 m)   Wt 154 lb 15.7 oz (70.3 kg) Comment: bed scale  LMP 08/24/2017   SpO2 100%   BMI 25.79 kg/m   General: NAD, feeling much better, tolerating BRAT diet now and fluids without vomiting.  CV: RRR Pulm: CTABL, nl effort ABD: s/nd/nt, gravid DVT Evaluation: LE non-ttp, no evidence of DVT on exam. FHR confirmed with bedside US this Am by Dr Leafy Ro.   Discharge Condition: Stable  Disposition: 01-Home or Self Care   Allergies as of 10/22/2017   No Known Allergies     Medication List    STOP taking these medications   metoCLOPramide 10 MG tablet Commonly known as:  REGLAN   ondansetron 4 MG tablet Commonly known as:  ZOFRAN   Prenatal Vitamins 0.8 MG tablet  vitamin B-6 25 MG tablet Commonly known as:  pyridOXINE     TAKE these medications   cephALEXin 500 MG capsule Commonly known as:  KEFLEX Take 1 capsule (500 mg total) by mouth 3 (three) times daily.   docusate sodium 100 MG capsule Commonly known as:   COLACE Take 1 capsule (100 mg total) by mouth daily.   ondansetron 4 MG disintegrating tablet Commonly known as:  ZOFRAN-ODT Take 1 tablet (4 mg total) by mouth every 6 (six) hours as needed for nausea or vomiting.   promethazine 12.5 MG tablet Commonly known as:  PHENERGAN Take 1 tablet (12.5 mg total) by mouth every 6 (six) hours as needed for nausea or vomiting. What changed:  Another medication with the same name was removed. Continue taking this medication, and follow the directions you see here.      Follow-up [facility]. Schedule an appointment as soon as possible for a visit in 1 week(s).   Contact information: [address] [place] [phone]          Signed: ----- Hassan Buckler A, CNM 10/22/17 4:04 PM

## 2017-10-22 NOTE — Progress Notes (Signed)
Obstetric and Gynecology  Subjective  Brenda Fox is a 27 y.o. female G1P0 who presented on 10/21/2017 for symptomatic nausea/vomiting in pregnancy.  Resting Started scheduled antiemetics with good result overnight. Dizziness resolved.  Advance diet to brat diet   Objective   Vitals:   10/22/17 0514 10/22/17 0830  BP: (!) 102/48 123/72  Pulse: 75 86  Resp:  18  Temp: 97.8 F (36.6 C) 98 F (36.7 C)  SpO2: 100% 100%     Intake/Output Summary (Last 24 hours) at 10/22/2017 0934 Last data filed at 10/22/2017 0145 Gross per 24 hour  Intake 2480 ml  Output 300 ml  Net 2180 ml    General: NAD Cardiovascular: RRR, no murmurs Pulmonary: CTAB Abdomen: Benign. Non-tender, +BS, no guarding. Extremities: No erythema or cords, no calf tenderness, +warmth with normal peripheral pulses.  Labs: Results for orders placed or performed during the hospital encounter of 10/21/17 (from the past 24 hour(s))  Lipase, blood     Status: None   Collection Time: 10/21/17  1:17 PM  Result Value Ref Range   Lipase 45 11 - 51 U/L  Comprehensive metabolic panel     Status: Abnormal   Collection Time: 10/21/17  1:17 PM  Result Value Ref Range   Sodium 135 135 - 145 mmol/L   Potassium 3.5 3.5 - 5.1 mmol/L   Chloride 99 (L) 101 - 111 mmol/L   CO2 22 22 - 32 mmol/L   Glucose, Bld 132 (H) 65 - 99 mg/dL   BUN 12 6 - 20 mg/dL   Creatinine, Ser 0.76 0.44 - 1.00 mg/dL   Calcium 9.6 8.9 - 10.3 mg/dL   Total Protein 8.5 (H) 6.5 - 8.1 g/dL   Albumin 4.2 3.5 - 5.0 g/dL   AST 39 15 - 41 U/L   ALT 62 (H) 14 - 54 U/L   Alkaline Phosphatase 48 38 - 126 U/L   Total Bilirubin 0.8 0.3 - 1.2 mg/dL   GFR calc non Af Amer >60 >60 mL/min   GFR calc Af Amer >60 >60 mL/min   Anion gap 14 5 - 15  CBC     Status: Abnormal   Collection Time: 10/21/17  1:17 PM  Result Value Ref Range   WBC 18.9 (H) 3.6 - 11.0 K/uL   RBC 4.93 3.80 - 5.20 MIL/uL   Hemoglobin 13.6 12.0 - 16.0 g/dL   HCT 40.8 35.0 - 47.0 %   MCV 82.8  80.0 - 100.0 fL   MCH 27.5 26.0 - 34.0 pg   MCHC 33.2 32.0 - 36.0 g/dL   RDW 13.4 11.5 - 14.5 %   Platelets 400 150 - 440 K/uL  Urinalysis, Complete w Microscopic     Status: Abnormal   Collection Time: 10/21/17  9:32 PM  Result Value Ref Range   Color, Urine AMBER (A) YELLOW   APPearance HAZY (A) CLEAR   Specific Gravity, Urine 1.018 1.005 - 1.030   pH 6.0 5.0 - 8.0   Glucose, UA NEGATIVE NEGATIVE mg/dL   Hgb urine dipstick NEGATIVE NEGATIVE   Bilirubin Urine NEGATIVE NEGATIVE   Ketones, ur 80 (A) NEGATIVE mg/dL   Protein, ur NEGATIVE NEGATIVE mg/dL   Nitrite NEGATIVE NEGATIVE   Leukocytes, UA NEGATIVE NEGATIVE   RBC / HPF 0-5 0 - 5 RBC/hpf   WBC, UA 0-5 0 - 5 WBC/hpf   Bacteria, UA RARE (A) NONE SEEN   Squamous Epithelial / LPF 0-5 (A) NONE SEEN   Mucus PRESENT  Comprehensive metabolic panel     Status: Abnormal   Collection Time: 10/22/17  5:24 AM  Result Value Ref Range   Sodium 140 135 - 145 mmol/L   Potassium 3.6 3.5 - 5.1 mmol/L   Chloride 109 101 - 111 mmol/L   CO2 23 22 - 32 mmol/L   Glucose, Bld 88 65 - 99 mg/dL   BUN 10 6 - 20 mg/dL   Creatinine, Ser 0.56 0.44 - 1.00 mg/dL   Calcium 8.0 (L) 8.9 - 10.3 mg/dL   Total Protein 6.0 (L) 6.5 - 8.1 g/dL   Albumin 3.0 (L) 3.5 - 5.0 g/dL   AST 29 15 - 41 U/L   ALT 48 14 - 54 U/L   Alkaline Phosphatase 35 (L) 38 - 126 U/L   Total Bilirubin 0.5 0.3 - 1.2 mg/dL   GFR calc non Af Amer >60 >60 mL/min   GFR calc Af Amer >60 >60 mL/min   Anion gap 8 5 - 15  CBC     Status: Abnormal   Collection Time: 10/22/17  5:24 AM  Result Value Ref Range   WBC 9.0 3.6 - 11.0 K/uL   RBC 3.77 (L) 3.80 - 5.20 MIL/uL   Hemoglobin 10.7 (L) 12.0 - 16.0 g/dL   HCT 31.4 (L) 35.0 - 47.0 %   MCV 83.1 80.0 - 100.0 fL   MCH 28.3 26.0 - 34.0 pg   MCHC 34.0 32.0 - 36.0 g/dL   RDW 13.5 11.5 - 14.5 %   Platelets 298 150 - 440 K/uL    Cultures: Results for orders placed or performed during the hospital encounter of 06/12/16  Chlamydia/NGC rt  PCR ([facility] only)     Status: None   Collection Time: 06/12/16  7:18 PM  Result Value Ref Range Status   Specimen source GC/Chlam ENDOCERVICAL  Final   Chlamydia Tr NOT DETECTED NOT DETECTED Final   N gonorrhoeae NOT DETECTED NOT DETECTED Final    Comment: (NOTE) 100  This methodology has not been evaluated in pregnant women or in 200  patients with a history of hysterectomy. 300 400  This methodology will not be performed on patients less than 76  years of age.   Wet prep, genital     Status: None   Collection Time: 06/12/16  7:18 PM  Result Value Ref Range Status   Yeast Wet Prep HPF POC NONE SEEN NONE SEEN Final   Trich, Wet Prep NONE SEEN NONE SEEN Final   Clue Cells Wet Prep HPF POC NONE SEEN NONE SEEN Final   WBC, Wet Prep HPF POC NONE SEEN NONE SEEN Final   Sperm NONE SEEN  Final    Imaging: US Ob Comp Less 14 Wks  Result Date: 09/24/2017 CLINICAL DATA:  Pain, early pregnancy. EXAM: OBSTETRIC <14 WK [place] AND TRANSVAGINAL OB US TECHNIQUE: Both transabdominal and transvaginal ultrasound examinations were performed for complete evaluation of the gestation as well as the maternal uterus, adnexal regions, and pelvic cul-de-sac. Transvaginal technique was performed to assess early pregnancy. COMPARISON:  None. FINDINGS: Intrauterine gestational sac: Visualized. Yolk sac:  Visualized. Embryo:  Visualized. Cardiac Activity: Visualized. Heart Rate: 126  bpm CRL:  6.6  mm   6 w   4 d                  [place] EDC: 05/16/2018 Subchorionic hemorrhage:  None visualized. Maternal uterus/adnexae: Ovaries are visualized. Corpus luteum cyst on the left. There is also a septated cystic structure  in the left ovary, measuring 3.1 x 1.6 x 2.2 cm. Small free fluid. IMPRESSION: 1. Single living intrauterine pregnancy with gestational age [redacted] weeks 4 days and estimated date of confinement of 05/16/2018. 2. Possible hemorrhagic cyst in the left ovary. Continued attention on followup exams is warranted as malignancy  cannot be definitively excluded. 3. Small free fluid. Electronically Signed   By: Lorin Picket M.D.   On: 09/24/2017 15:40   US Ob Transvaginal  Result Date: 09/24/2017 CLINICAL DATA:  Pain, early pregnancy. EXAM: OBSTETRIC <14 WK [place] AND TRANSVAGINAL OB US TECHNIQUE: Both transabdominal and transvaginal ultrasound examinations were performed for complete evaluation of the gestation as well as the maternal uterus, adnexal regions, and pelvic cul-de-sac. Transvaginal technique was performed to assess early pregnancy. COMPARISON:  None. FINDINGS: Intrauterine gestational sac: Visualized. Yolk sac:  Visualized. Embryo:  Visualized. Cardiac Activity: Visualized. Heart Rate: 126  bpm CRL:  6.6  mm   6 w   4 d                  [place] EDC: 05/16/2018 Subchorionic hemorrhage:  None visualized. Maternal uterus/adnexae: Ovaries are visualized. Corpus luteum cyst on the left. There is also a septated cystic structure in the left ovary, measuring 3.1 x 1.6 x 2.2 cm. Small free fluid. IMPRESSION: 1. Single living intrauterine pregnancy with gestational age [redacted] weeks 4 days and estimated date of confinement of 05/16/2018. 2. Possible hemorrhagic cyst in the left ovary. Continued attention on followup exams is warranted as malignancy cannot be definitively excluded. 3. Small free fluid. Electronically Signed   By: Lorin Picket M.D.   On: 09/24/2017 15:40     Assessment   26 y.o. [facility] Day: 2   Plan   1. Ultrasound on L&D broken, will send for formal scan as bedside dopplers unable to process this early age 40. Monitor nausea.  3. Plan for discharge tonight with scheduled meds if improved 4. Decrease phenergan and start compazine for sleepiness

## 2017-10-25 ENCOUNTER — Emergency Department
Admission: EM | Admit: 2017-10-25 | Discharge: 2017-10-25 | Disposition: A | Discharge status: Home / Self Care | Payer: Medicaid Other | Attending: Emergency Medicine | Admitting: Emergency Medicine

## 2017-10-25 ENCOUNTER — Encounter: Payer: Self-pay | Admitting: Emergency Medicine

## 2017-10-25 ENCOUNTER — Other Ambulatory Visit: Payer: Self-pay

## 2017-10-25 DIAGNOSIS — Z3A00 Weeks of gestation of pregnancy not specified: Secondary | ICD-10-CM | POA: Diagnosis not present

## 2017-10-25 DIAGNOSIS — Z8541 Personal history of malignant neoplasm of cervix uteri: Secondary | ICD-10-CM | POA: Diagnosis not present

## 2017-10-25 DIAGNOSIS — O219 Vomiting of pregnancy, unspecified: Secondary | ICD-10-CM | POA: Diagnosis present

## 2017-10-25 DIAGNOSIS — O210 Mild hyperemesis gravidarum: Secondary | ICD-10-CM | POA: Insufficient documentation

## 2017-10-25 DIAGNOSIS — R1110 Vomiting, unspecified: Secondary | ICD-10-CM

## 2017-10-25 HISTORY — DX: Malignant neoplasm of cervix uteri, unspecified: C53.9

## 2017-10-25 LAB — URINALYSIS, COMPLETE (UACMP) WITH MICROSCOPIC
BILIRUBIN URINE: NEGATIVE
Bacteria, UA: NONE SEEN
HGB URINE DIPSTICK: NEGATIVE
KETONES UR: 20 mg/dL — AB
LEUKOCYTES UA: NEGATIVE
Nitrite: NEGATIVE
PH: 6 (ref 5.0–8.0)
PROTEIN: NEGATIVE mg/dL
Specific Gravity, Urine: 1.02 (ref 1.005–1.030)

## 2017-10-25 LAB — COMPREHENSIVE METABOLIC PANEL
ALK PHOS: 49 U/L (ref 38–126)
ALT: 52 U/L (ref 14–54)
ANION GAP: 11 (ref 5–15)
AST: 36 U/L (ref 15–41)
Albumin: 3.9 g/dL (ref 3.5–5.0)
BUN: 9 mg/dL (ref 6–20)
CALCIUM: 9.3 mg/dL (ref 8.9–10.3)
CHLORIDE: 101 mmol/L (ref 101–111)
CO2: 23 mmol/L (ref 22–32)
Creatinine, Ser: 0.6 mg/dL (ref 0.44–1.00)
Glucose, Bld: 89 mg/dL (ref 65–99)
Potassium: 3.4 mmol/L — ABNORMAL LOW (ref 3.5–5.1)
SODIUM: 135 mmol/L (ref 135–145)
Total Bilirubin: 0.5 mg/dL (ref 0.3–1.2)
Total Protein: 7.6 g/dL (ref 6.5–8.1)

## 2017-10-25 LAB — CBC
HCT: 38.7 % (ref 35.0–47.0)
HEMOGLOBIN: 12.9 g/dL (ref 12.0–16.0)
MCH: 27.6 pg (ref 26.0–34.0)
MCHC: 33.3 g/dL (ref 32.0–36.0)
MCV: 82.8 fL (ref 80.0–100.0)
PLATELETS: 355 10*3/uL (ref 150–440)
RBC: 4.68 MIL/uL (ref 3.80–5.20)
RDW: 13.3 % (ref 11.5–14.5)
WBC: 10.9 10*3/uL (ref 3.6–11.0)

## 2017-10-25 LAB — LIPASE, BLOOD: Lipase: 30 U/L (ref 11–51)

## 2017-10-25 MED ORDER — PROMETHAZINE HCL 25 MG/ML IJ SOLN
INTRAMUSCULAR | Status: AC
  Administered 2017-10-25: 12.5 mg via INTRAVENOUS
  Filled 2017-10-25: qty 1

## 2017-10-25 MED ORDER — SODIUM CHLORIDE 0.9 % IV BOLUS (SEPSIS)
1000.0000 mL | Freq: Once | INTRAVENOUS | Status: AC
  Administered 2017-10-25: 1000 mL via INTRAVENOUS

## 2017-10-25 MED ORDER — DEXTROSE 5 % AND 0.45 % NACL IV BOLUS
1000.0000 mL | Freq: Once | INTRAVENOUS | Status: AC
  Administered 2017-10-25: 1000 mL via INTRAVENOUS

## 2017-10-25 MED ORDER — LACTATED RINGERS IV BOLUS (SEPSIS)
1000.0000 mL | Freq: Once | INTRAVENOUS | Status: AC
  Administered 2017-10-25: 1000 mL via INTRAVENOUS

## 2017-10-25 MED ORDER — PROMETHAZINE HCL 25 MG/ML IJ SOLN
12.5000 mg | Freq: Once | INTRAMUSCULAR | Status: AC
  Administered 2017-10-25: 12.5 mg via INTRAVENOUS

## 2017-10-25 NOTE — ED Notes (Signed)
Given spirte for PO challenge per dr Burlene Arnt.  Husband at bedside, no needs

## 2017-10-25 NOTE — ED Triage Notes (Signed)
Says vomiting since she was here last.  Is pregnant.  Lips are dry appearing.

## 2017-10-25 NOTE — ED Provider Notes (Addendum)
[facility] Emergency Department Provider Note  ____________________________________________   I have reviewed the triage vital signs and the nursing notes. Where available I have reviewed prior notes and, if possible and indicated, outside hospital notes.    HISTORY  Chief Complaint Emesis    HPI Brenda Fox is a 27 y.o. female who is G4 P1, has hyperemesis gravidarum with last pregnancy as well, has significant hyperemesis this time was recently admitted and sent home from the hospital for hyperemesis, has been sent home with Zofran and Phenergan by OB/GYN, states that she is still continuing to vomit everything she puts in her mouth.  Denies weight loss fever or abdominal pain.  Has no gush of blood or vaginal bleeding.  Is not actively vomiting at this time.  Past Medical History:  Diagnosis Date  . Cancer of cervix [facility])     Patient Active Problem List   Diagnosis Date Noted  . Hyperemesis arising during pregnancy 10/21/2017    Past Surgical History:  Procedure Laterality Date  . procedure for cervical cancer      Prior to Admission medications   Medication Sig Start Date End Date Taking? Authorizing Provider  cephALEXin (KEFLEX) 500 MG capsule Take 1 capsule (500 mg total) by mouth 3 (three) times daily. 10/15/17   Hinda Kehr, MD  docusate sodium (COLACE) 100 MG capsule Take 1 capsule (100 mg total) by mouth daily. 10/22/17   McVey, Murray Hodgkins, CNM  ondansetron (ZOFRAN-ODT) 4 MG disintegrating tablet Take 1 tablet (4 mg total) by mouth every 6 (six) hours as needed for nausea or vomiting. 10/22/17   McVey, Murray Hodgkins, CNM  promethazine (PHENERGAN) 12.5 MG tablet Take 1 tablet (12.5 mg total) by mouth every 6 (six) hours as needed for nausea or vomiting. 09/28/17   Schuyler Amor, MD    Allergies Patient has no known allergies.  No family history on file.  Social History Social History   Tobacco Use  . Smoking status: Never Smoker  .  Smokeless tobacco: Never Used  Substance Use Topics  . Alcohol use: No  . Drug use: No    Review of Systems Constitutional: No fever/chills Eyes: No visual changes. ENT: No sore throat. No stiff neck no neck pain Cardiovascular: Denies chest pain. Respiratory: Denies shortness of breath. Gastrointestinal:   + vomiting.  No diarrhea.  No constipation. Genitourinary: Negative for dysuria. Musculoskeletal: Negative lower extremity swelling Skin: Negative for rash. Neurological: Negative for severe headaches, focal weakness or numbness.   ____________________________________________   PHYSICAL EXAM:  VITAL SIGNS: ED Triage Vitals  Enc Vitals Group     BP 10/25/17 1319 (!) 119/59     Pulse Rate 10/25/17 1319 (!) 106     Resp 10/25/17 1319 16     Temp 10/25/17 1319 99 F (37.2 C)     Temp Source 10/25/17 1319 Oral     SpO2 10/25/17 1319 100 %     Weight 10/25/17 1319 154 lb (69.9 kg)     Height 10/25/17 1319 5\' 5"  (1.651 m)     Head Circumference --      Peak Flow --      Pain Score 10/25/17 1318 0     Pain Loc --      Pain Edu? --      Excl. in [place]? --     Constitutional: Alert and oriented. Well appearing and in no acute distress. Eyes: Conjunctivae are normal Head: Atraumatic HEENT: No congestion/rhinnorhea. Mucous membranes are  lightly dry.  Oropharynx non-erythematous Neck:   Nontender with no meningismus, no masses, no stridor Cardiovascular: Normal rate, regular rhythm. Grossly normal heart sounds.  Good peripheral circulation. Respiratory: Normal respiratory effort.  No retractions. Lungs CTAB. Abdominal: Soft and nontender. No distention. No guarding no rebound Back:  There is no focal tenderness or step off.  there is no midline tenderness there are no lesions noted. there is no CVA tenderness  Musculoskeletal: No lower extremity tenderness, no upper extremity tenderness. No joint effusions, no DVT signs strong distal pulses no edema Neurologic:  Normal  speech and language. No gross focal neurologic deficits are appreciated.  Skin:  Skin is warm, dry and intact. No rash noted. Psychiatric: Mood and affect are normal. Speech and behavior are normal.  ____________________________________________   LABS (all labs ordered are listed, but only abnormal results are displayed)  Labs Reviewed  COMPREHENSIVE METABOLIC PANEL - Abnormal; Notable for the following components:      Result Value   Potassium 3.4 (*)    All other components within normal limits  CBC  URINALYSIS, COMPLETE (UACMP) WITH MICROSCOPIC  LIPASE, BLOOD    Pertinent labs  results that were available during my care of the patient were reviewed by me and considered in my medical decision making (see chart for details). ____________________________________________  EKG  I personally interpreted any EKGs ordered by me or triage  ____________________________________________  RADIOLOGY  Pertinent labs & imaging results that were available during my care of the patient were reviewed by me and considered in my medical decision making (see chart for details). If possible, patient and/or family made aware of any abnormal findings.  No results found. ____________________________________________    PROCEDURES  Procedure(s) performed: None  Procedures  Critical Care performed: None  ____________________________________________   INITIAL IMPRESSION / ASSESSMENT AND PLAN / ED COURSE  Pertinent labs & imaging results that were available during my care of the patient were reviewed by me and considered in my medical decision making (see chart for details).  Abdomen is benign chronic recurrent dental consistent with hyperemesis gravidarum.  Liver function tests are reassuring nothing to suggest gallbladder disease, we will send a lipase but nothing to suggest clinically the patient's pancreatitis.  Urinalysis is pending we will determine her degree of ketosis.  Patient  continues to receive IV fluids here.  No evidence of GU complication with known IUP via history or exam thus far.  ----------------------------------------- 5:07 PM on 10/25/2017 -----------------------------------------  Patient in no acute distress feels much better would like to try p.o., she only has 20+ ketones in her urine this time which is certainly an improvement, potassium etc. are reassuring, we will discussed with OB prior to discharge, patient does feel safe for discharge has adequate antiemetics at home prescribed by OB.  ----------------------------------------- 5:40 PM on 10/25/2017 -----------------------------------------  Feeling much better discussed with Dr. Leonides Schanz, of OB/GYN, she does not feel imaging is indicated or any change in management, they will continue to monitor as an outpatient return precautions follow-up given and understood abdomen benign at discharge    ____________________________________________   FINAL CLINICAL IMPRESSION(S) / ED DIAGNOSES  Final diagnoses:  None      This chart was dictated using voice recognition software.  Despite best efforts to proofread,  errors can occur which can change meaning.      Schuyler Amor, MD 10/25/17 1534    Schuyler Amor, MD 10/25/17 1707    Schuyler Amor, MD 10/25/17 1740

## 2017-10-31 ENCOUNTER — Encounter: Payer: Self-pay | Admitting: Emergency Medicine

## 2017-10-31 ENCOUNTER — Emergency Department
Admission: EM | Admit: 2017-10-31 | Discharge: 2017-10-31 | Disposition: A | Discharge status: Home / Self Care | Payer: Medicaid Other | Attending: Emergency Medicine | Admitting: Emergency Medicine

## 2017-10-31 DIAGNOSIS — Z79899 Other long term (current) drug therapy: Secondary | ICD-10-CM | POA: Insufficient documentation

## 2017-10-31 DIAGNOSIS — O219 Vomiting of pregnancy, unspecified: Secondary | ICD-10-CM | POA: Insufficient documentation

## 2017-10-31 DIAGNOSIS — Z3A11 11 weeks gestation of pregnancy: Secondary | ICD-10-CM | POA: Diagnosis not present

## 2017-10-31 LAB — COMPREHENSIVE METABOLIC PANEL
ALK PHOS: 46 U/L (ref 38–126)
ALT: 23 U/L (ref 14–54)
AST: 18 U/L (ref 15–41)
Albumin: 3.7 g/dL (ref 3.5–5.0)
Anion gap: 7 (ref 5–15)
BUN: 10 mg/dL (ref 6–20)
CALCIUM: 9.4 mg/dL (ref 8.9–10.3)
CO2: 24 mmol/L (ref 22–32)
CREATININE: 0.75 mg/dL (ref 0.44–1.00)
Chloride: 102 mmol/L (ref 101–111)
Glucose, Bld: 87 mg/dL (ref 65–99)
Potassium: 3.8 mmol/L (ref 3.5–5.1)
Sodium: 133 mmol/L — ABNORMAL LOW (ref 135–145)
Total Bilirubin: 0.4 mg/dL (ref 0.3–1.2)
Total Protein: 7.4 g/dL (ref 6.5–8.1)

## 2017-10-31 LAB — URINALYSIS, COMPLETE (UACMP) WITH MICROSCOPIC
Bacteria, UA: NONE SEEN
Bilirubin Urine: NEGATIVE
GLUCOSE, UA: NEGATIVE mg/dL
Hgb urine dipstick: NEGATIVE
KETONES UR: 80 mg/dL — AB
LEUKOCYTES UA: NEGATIVE
Nitrite: NEGATIVE
Protein, ur: NEGATIVE mg/dL
SPECIFIC GRAVITY, URINE: 1.014 (ref 1.005–1.030)
pH: 5 (ref 5.0–8.0)

## 2017-10-31 LAB — LIPASE, BLOOD: Lipase: 27 U/L (ref 11–51)

## 2017-10-31 LAB — CBC
HCT: 37.1 % (ref 35.0–47.0)
Hemoglobin: 12.5 g/dL (ref 12.0–16.0)
MCH: 27.9 pg (ref 26.0–34.0)
MCHC: 33.6 g/dL (ref 32.0–36.0)
MCV: 82.9 fL (ref 80.0–100.0)
Platelets: 325 10*3/uL (ref 150–440)
RBC: 4.47 MIL/uL (ref 3.80–5.20)
RDW: 13.2 % (ref 11.5–14.5)
WBC: 10.5 10*3/uL (ref 3.6–11.0)

## 2017-10-31 LAB — HCG, QUANTITATIVE, PREGNANCY: hCG, Beta Chain, Quant, S: 110851 m[IU]/mL — ABNORMAL HIGH (ref <5)

## 2017-10-31 MED ORDER — METOCLOPRAMIDE HCL 5 MG/ML IJ SOLN
INTRAMUSCULAR | Status: AC
  Administered 2017-10-31: 10 mg via INTRAVENOUS
  Filled 2017-10-31: qty 2

## 2017-10-31 MED ORDER — SODIUM CHLORIDE 0.9 % IV BOLUS (SEPSIS)
500.0000 mL | Freq: Once | INTRAVENOUS | Status: AC
  Administered 2017-10-31: 500 mL via INTRAVENOUS

## 2017-10-31 MED ORDER — METOCLOPRAMIDE HCL 5 MG/ML IJ SOLN
10.0000 mg | Freq: Once | INTRAMUSCULAR | Status: AC
  Administered 2017-10-31: 10 mg via INTRAVENOUS

## 2017-10-31 MED ORDER — DEXTROSE 5 % IV SOLN
Freq: Once | INTRAVENOUS | Status: AC
  Administered 2017-10-31: 14:00:00 via INTRAVENOUS

## 2017-10-31 MED ORDER — SODIUM CHLORIDE 0.9 % IV BOLUS (SEPSIS)
1000.0000 mL | Freq: Once | INTRAVENOUS | Status: AC
  Administered 2017-10-31: 1000 mL via INTRAVENOUS

## 2017-10-31 MED ORDER — ONDANSETRON HCL 4 MG PO TABS: 4.0000 mg | ORAL_TABLET | Freq: Three times a day (TID) | ORAL | 0 refills | Status: DC | PRN

## 2017-10-31 MED ORDER — ONDANSETRON HCL 4 MG/2ML IJ SOLN
4.0000 mg | Freq: Once | INTRAMUSCULAR | Status: AC
  Administered 2017-10-31: 4 mg via INTRAVENOUS
  Filled 2017-10-31: qty 2

## 2017-10-31 MED ORDER — PROMETHAZINE HCL 12.5 MG PO TABS: 12.5000 mg | ORAL_TABLET | Freq: Four times a day (QID) | ORAL | 0 refills | Status: DC | PRN

## 2017-10-31 MED ORDER — ONDANSETRON HCL 4 MG/2ML IJ SOLN
4.0000 mg | Freq: Once | INTRAMUSCULAR | Status: DC | PRN
  Filled 2017-10-31: qty 2

## 2017-10-31 NOTE — Discharge Instructions (Signed)
You are evaluated for nausea and vomiting in early pregnancy and given IV fluids here.  Your exam and evaluation are overall reassuring in the emergency room today.  We did discuss refilling her Zofran prescription.  You are given the office number for [facility] that you are trying to establish care.  Return to the emergency room immediately for any worsening condition including vomiting with concern for dehydration such as dry mouth, not making urine, fever, abdominal pain, vaginal bleeding or vaginal discharge, or any other symptoms concerning to you.

## 2017-10-31 NOTE — ED Triage Notes (Signed)
FN: pt eleven weeks preg and with nausea.

## 2017-10-31 NOTE — ED Triage Notes (Signed)
Pt comes into the ED via POV c/o emesis and being [redacted] weeks pregnant.  Patient has been seen here multiple times for hyperemesis gravidarum.  Patient states she has vomited x 8 times today.  Patient has dry and cracked lips and states she is having a hard time keeping things down.  Respirations are even and unlabored and patient in NAD at this time.

## 2017-10-31 NOTE — ED Provider Notes (Signed)
[facility] Emergency Department Provider Note ____________________________________________   I have reviewed the triage vital signs and the triage nursing note.  HISTORY  Chief Complaint Hyperemesis Gravidarum   Historian Patient  HPI Brenda Fox is a 27 y.o. female patient does not want to tell me how many times she is been pregnant, but states that she has had emesis and hyperemesis with prior pregnancies, is approximately [redacted] weeks pregnant and had an ultrasound confirming IUP about a week ago in the emergency department, and had been on dye Cletus/B6 up until about a week ago when she was switched to Phenergan and Zofran.  She stated that she was having pretty decent control until yesterday.  She is out of the Zofran.  No vaginal discharge or vaginal bleeding.   Past Medical History:  Diagnosis Date  . Cancer of cervix [facility])     Patient Active Problem List   Diagnosis Date Noted  . Hyperemesis arising during pregnancy 10/21/2017    Past Surgical History:  Procedure Laterality Date  . procedure for cervical cancer      Prior to Admission medications   Medication Sig Start Date End Date Taking? Authorizing Provider  cephALEXin (KEFLEX) 500 MG capsule Take 1 capsule (500 mg total) by mouth 3 (three) times daily. Patient not taking: Reported on 10/31/2017 10/15/17   Hinda Kehr, MD  docusate sodium (COLACE) 100 MG capsule Take 1 capsule (100 mg total) by mouth daily. 10/22/17   McVey, Murray Hodgkins, CNM  ondansetron (ZOFRAN) 4 MG tablet Take 1 tablet (4 mg total) by mouth every 8 (eight) hours as needed for nausea or vomiting. 10/31/17   Lisa Roca, MD  ondansetron (ZOFRAN-ODT) 4 MG disintegrating tablet Take 1 tablet (4 mg total) by mouth every 6 (six) hours as needed for nausea or vomiting. 10/22/17   McVey, Murray Hodgkins, CNM  promethazine (PHENERGAN) 12.5 MG tablet Take 1 tablet (12.5 mg total) by mouth every 6 (six) hours as needed for nausea or vomiting.  09/28/17   Schuyler Amor, MD    No Known Allergies  No family history on file.  Social History Social History   Tobacco Use  . Smoking status: Never Smoker  . Smokeless tobacco: Never Used  Substance Use Topics  . Alcohol use: No  . Drug use: No    Review of Systems  Constitutional: Negative for fever. Eyes: Negative for visual changes. ENT: Negative for sore throat. Cardiovascular: Negative for chest pain. Respiratory: Negative for shortness of breath. Gastrointestinal: Negative for abdominal pain, diarrhea. Genitourinary: Negative for dysuria. Musculoskeletal: Negative for back pain. Skin: Negative for rash. Neurological: Negative for headache.  ____________________________________________   PHYSICAL EXAM:  VITAL SIGNS: ED Triage Vitals [10/31/17 1337]  Enc Vitals Group     BP (!) 109/44     Pulse Rate 84     Resp 17     Temp 98.5 F (36.9 C)     Temp Source Oral     SpO2 99 %     Weight 154 lb (69.9 kg)     Height 5\' 5"  (1.651 m)     Head Circumference      Peak Flow      Pain Score      Pain Loc      Pain Edu?      Excl. in [place]?      Constitutional: Alert and oriented. Well appearing and in no distress. HEENT   Head: Normocephalic and atraumatic.  Eyes: Conjunctivae are normal. Pupils equal and round.       Ears:         Nose: No congestion/rhinnorhea.   Mouth/Throat: Mucous membranes are moist.   Neck: No stridor. Cardiovascular/Chest: Normal rate, regular rhythm.  No murmurs, rubs, or gallops. Respiratory: Normal respiratory effort without tachypnea nor retractions. Breath sounds are clear and equal bilaterally. No wheezes/rales/rhonchi. Gastrointestinal: Soft. No distention, no guarding, no rebound. Nontender.    Genitourinary/rectal:Deferred Musculoskeletal: Nontender with normal range of motion in all extremities. No joint effusions.  No lower extremity tenderness.  No edema. Neurologic:  Normal speech and language. No gross  or focal neurologic deficits are appreciated. Skin:  Skin is warm, dry and intact. No rash noted. Psychiatric: Mood and affect are normal. Speech and behavior are normal. Patient exhibits appropriate insight and judgment.   ____________________________________________  LABS (pertinent positives/negatives) I, Lisa Roca, MD the attending physician have reviewed the labs noted below.  Labs Reviewed  COMPREHENSIVE METABOLIC PANEL - Abnormal; Notable for the following components:      Result Value   Sodium 133 (*)    All other components within normal limits  URINALYSIS, COMPLETE (UACMP) WITH MICROSCOPIC - Abnormal; Notable for the following components:   Color, Urine YELLOW (*)    APPearance HAZY (*)    Ketones, ur 80 (*)    Squamous Epithelial / LPF 0-5 (*)    All other components within normal limits  HCG, QUANTITATIVE, PREGNANCY - Abnormal; Notable for the following components:   hCG, Beta Chain, Quant, S 110,851 (*)    All other components within normal limits  LIPASE, BLOOD  CBC     __________________________________________  PROCEDURES  Procedure(s) performed: None  Critical Care performed: None   ____________________________________________  ED COURSE / ASSESSMENT AND PLAN  Pertinent labs & imaging results that were available during my care of the patient were reviewed by me and considered in my medical decision making (see chart for details).    Patient has history with severe nausea during this pregnancy as well as previous pregnancies.  No significant abdominal pain or any vaginal complaints or vaginal bleeding.  Patient received Reglan earlier as well as liter fluid, she still looks a little dry clinically, will give her second liter of fluid and Zofran.  She is requesting a Zofran refill.  I hope and suspect she will likely be able be discharged home.  Awaiting urinalysis.  No evidence for UTI.  We discussed going back on dye Cletus as a baseline  level for nausea control, use of Phenergan secondarily and she would like a refill.  We discussed the questionable nature of any craniofacial defects with Zofran and risk versus benefit, and she would like to use this as a last resort.    Patient / Family / Caregiver informed of clinical course, medical decision-making process, and agree with plan.   I discussed return precautions, follow-up instructions, and discharge instructions with patient and/or family.  Discharge Instructions : You are evaluated for nausea and vomiting in early pregnancy and given IV fluids here.  Your exam and evaluation are overall reassuring in the emergency room today.  We did discuss refilling her Zofran prescription.  You are given the office number for [facility] clinic OB/GYN that you are trying to establish care.  Return to the emergency room immediately for any worsening condition including vomiting with concern for dehydration such as dry mouth, not making urine, fever, abdominal pain, vaginal bleeding or vaginal discharge, or any  other symptoms concerning to you.    ___________________________________________   FINAL CLINICAL IMPRESSION(S) / ED DIAGNOSES   Final diagnoses:  Nausea and vomiting in pregnancy      ___________________________________________        Note: This dictation was prepared with Dragon dictation. Any transcriptional errors that result from this process are unintentional    Lisa Roca, MD 10/31/17 2011

## 2017-10-31 NOTE — ED Notes (Signed)
Informed RN that patient has been roomed and is ready for evaluation.  Patient in NAD at this time and call bell placed within reach.   

## 2017-11-06 ENCOUNTER — Emergency Department
Admission: EM | Admit: 2017-11-06 | Discharge: 2017-11-06 | Disposition: A | Discharge status: Home / Self Care | Payer: Medicaid Other | Attending: Emergency Medicine | Admitting: Emergency Medicine

## 2017-11-06 ENCOUNTER — Encounter: Payer: Self-pay | Admitting: Emergency Medicine

## 2017-11-06 DIAGNOSIS — Z3A12 12 weeks gestation of pregnancy: Secondary | ICD-10-CM | POA: Insufficient documentation

## 2017-11-06 DIAGNOSIS — O210 Mild hyperemesis gravidarum: Secondary | ICD-10-CM | POA: Diagnosis present

## 2017-11-06 DIAGNOSIS — Z79899 Other long term (current) drug therapy: Secondary | ICD-10-CM | POA: Diagnosis not present

## 2017-11-06 LAB — COMPREHENSIVE METABOLIC PANEL
ALK PHOS: 44 U/L (ref 38–126)
ALT: 42 U/L (ref 14–54)
AST: 33 U/L (ref 15–41)
Albumin: 3.9 g/dL (ref 3.5–5.0)
Anion gap: 10 (ref 5–15)
BUN: 11 mg/dL (ref 6–20)
CALCIUM: 9 mg/dL (ref 8.9–10.3)
CO2: 23 mmol/L (ref 22–32)
CREATININE: 0.58 mg/dL (ref 0.44–1.00)
Chloride: 102 mmol/L (ref 101–111)
GFR calc non Af Amer: >60 mL/min (ref >60)
GLUCOSE: 86 mg/dL (ref 65–99)
Potassium: 3.7 mmol/L (ref 3.5–5.1)
SODIUM: 135 mmol/L (ref 135–145)
Total Bilirubin: 0.4 mg/dL (ref 0.3–1.2)
Total Protein: 7.8 g/dL (ref 6.5–8.1)

## 2017-11-06 LAB — CBC
HCT: 36.9 % (ref 35.0–47.0)
Hemoglobin: 12.5 g/dL (ref 12.0–16.0)
MCH: 28.3 pg (ref 26.0–34.0)
MCHC: 33.8 g/dL (ref 32.0–36.0)
MCV: 83.8 fL (ref 80.0–100.0)
PLATELETS: 389 10*3/uL (ref 150–440)
RBC: 4.41 MIL/uL (ref 3.80–5.20)
RDW: 13.9 % (ref 11.5–14.5)
WBC: 13.4 10*3/uL — ABNORMAL HIGH (ref 3.6–11.0)

## 2017-11-06 MED ORDER — SODIUM CHLORIDE 0.9 % IV BOLUS (SEPSIS)
500.0000 mL | Freq: Once | INTRAVENOUS | Status: AC
  Administered 2017-11-06: 500 mL via INTRAVENOUS

## 2017-11-06 MED ORDER — SODIUM CHLORIDE 0.9 % IV BOLUS (SEPSIS)
1000.0000 mL | Freq: Once | INTRAVENOUS | Status: AC
  Administered 2017-11-06: 1000 mL via INTRAVENOUS

## 2017-11-06 MED ORDER — PROMETHAZINE HCL 25 MG PO TABS: 25.0000 mg | ORAL_TABLET | Freq: Four times a day (QID) | ORAL | 0 refills | Status: DC | PRN

## 2017-11-06 MED ORDER — PROMETHAZINE HCL 25 MG/ML IJ SOLN
25.0000 mg | Freq: Once | INTRAMUSCULAR | Status: AC
  Administered 2017-11-06: 25 mg via INTRAVENOUS
  Filled 2017-11-06: qty 1

## 2017-11-06 NOTE — ED Triage Notes (Signed)
Pt [redacted] weeks pregnant with n/v, denies any vaginal bleeding. Has been constant vomiting 6-7 times today. Pt is being seen by [facility]. Pt here last week for same and fluids were administered and she states that that makes her feel better.

## 2017-11-06 NOTE — ED Provider Notes (Signed)
[facility] Emergency Department Provider Note  Time seen: 6:07 PM  I have reviewed the triage vital signs and the nursing notes.   HISTORY  Chief Complaint Emesis    HPI Brenda Fox is a 27 y.o. female [id number] who presents approximately [redacted] weeks pregnant for nausea vomiting.  According to the patient she has been diagnosed with hyperemesis gravidarum, also suffered from hyperemesis gravidarum during her prior pregnancy.  Patient states since yesterday afternoon she has been vomiting unable to keep down any fluids that she came to the emergency department for nausea medication and fluids.  Patient is prescribed Phenergan at home but states when she starts vomiting the Phenergan does not work.  States she has tried rectal/suppository Phenergan in the past without success either.  Patient does not want Zofran due to medical concerns with Zofran use.  Patient denies any abdominal pain, vaginal bleeding or discharge.  Largely negative review of systems.  Past Medical History:  Diagnosis Date  . Cancer of cervix [facility])     Patient Active Problem List   Diagnosis Date Noted  . Hyperemesis arising during pregnancy 10/21/2017    Past Surgical History:  Procedure Laterality Date  . procedure for cervical cancer      Prior to Admission medications   Medication Sig Start Date End Date Taking? Authorizing Provider  cephALEXin (KEFLEX) 500 MG capsule Take 1 capsule (500 mg total) by mouth 3 (three) times daily. Patient not taking: Reported on 10/31/2017 10/15/17   Hinda Kehr, MD  docusate sodium (COLACE) 100 MG capsule Take 1 capsule (100 mg total) by mouth daily. 10/22/17   McVey, Murray Hodgkins, CNM  ondansetron (ZOFRAN) 4 MG tablet Take 1 tablet (4 mg total) by mouth every 8 (eight) hours as needed for nausea or vomiting. 10/31/17   Lisa Roca, MD  ondansetron (ZOFRAN-ODT) 4 MG disintegrating tablet Take 1 tablet (4 mg total) by mouth every 6 (six) hours as needed for  nausea or vomiting. 10/22/17   McVey, Murray Hodgkins, CNM  promethazine (PHENERGAN) 12.5 MG tablet Take 1 tablet (12.5 mg total) by mouth every 6 (six) hours as needed for nausea or vomiting. 10/31/17   Lisa Roca, MD    No Known Allergies  No family history on file.  Social History Social History   Tobacco Use  . Smoking status: Never Smoker  . Smokeless tobacco: Never Used  Substance Use Topics  . Alcohol use: No  . Drug use: No    Review of Systems Constitutional: Negative for fever. Eyes: Negative for visual complaints ENT: Negative for recent illness/congestion Cardiovascular: Negative for chest pain. Respiratory: Negative for shortness of breath. Gastrointestinal: Negative for abdominal pain positive for nausea vomiting. Genitourinary: Negative for vaginal bleeding or discharge. Musculoskeletal: Negative for musculoskeletal complaints Skin: Negative for skin complaints  Neurological: Negative for headache All other ROS negative  ____________________________________________   PHYSICAL EXAM:  VITAL SIGNS: ED Triage Vitals  Enc Vitals Group     BP 11/06/17 1508 117/61     Pulse Rate 11/06/17 1508 93     Resp 11/06/17 1508 20     Temp 11/06/17 1508 99.1 F (37.3 C)     Temp Source 11/06/17 1508 Oral     SpO2 11/06/17 1508 100 %     Weight 11/06/17 1508 150 lb (68 kg)     Height 11/06/17 1508 5\' 5"  (1.651 m)     Head Circumference --      Peak Flow --  Pain Score 11/06/17 1521 0     Pain Loc --      Pain Edu? --      Excl. in [place]? --    Constitutional: Alert and oriented. Well appearing and in no distress. Eyes: Normal exam ENT   Head: Normocephalic and atraumatic.   Mouth/Throat: Mucous membranes are moist. Cardiovascular: Normal rate, regular rhythm. No murmur Respiratory: Normal respiratory effort without tachypnea nor retractions. Breath sounds are clear  Gastrointestinal: Soft and nontender. No distention. Musculoskeletal: Nontender with  normal range of motion in all extremities. Neurologic:  Normal speech and language. No gross focal neurologic deficits Skin:  Skin is warm, dry and intact.  Psychiatric: Mood and affect are normal.   ____________________________________________   INITIAL IMPRESSION / ASSESSMENT AND PLAN / ED COURSE  Pertinent labs & imaging results that were available during my care of the patient were reviewed by me and considered in my medical decision making (see chart for details).  Patient presented to the emergency department for nausea vomiting.  Differential would include hyperemesis gravidarum, morning sickness, gastroenteritis.  Patient's labs are reassuring very slight leukocytosis of 13,000, electrolytes are normal, chemistry is normal including a normal anion gap.  We will IV hydrate in the emergency department dose IV Phenergan and continue to closely monitor.  Patient has a completely nontender abdominal exam.  We will use a bedside ultrasound to confirm living pregnancy.  Patient is feeling much better after medication and fluids.  Bedside ultrasound performed by myself shows normal heart rate of 158 bpm.  Good fetal movement.  We will discharge the patient with Phenergan.  Patient will follow up with her doctor.  ____________________________________________   FINAL CLINICAL IMPRESSION(S) / ED DIAGNOSES  Hyperemesis gravidarum    Harvest Dark, MD 11/06/17 2022

## 2017-11-19 ENCOUNTER — Encounter: Payer: Self-pay | Admitting: Emergency Medicine

## 2017-11-19 ENCOUNTER — Emergency Department
Admission: EM | Admit: 2017-11-19 | Discharge: 2017-11-19 | Disposition: A | Discharge status: Home / Self Care | Payer: Medicaid Other | Attending: Student in an Organized Health Care Education/Training Program | Admitting: Student in an Organized Health Care Education/Training Program

## 2017-11-19 ENCOUNTER — Other Ambulatory Visit: Payer: Self-pay

## 2017-11-19 DIAGNOSIS — R102 Pelvic and perineal pain: Secondary | ICD-10-CM | POA: Insufficient documentation

## 2017-11-19 DIAGNOSIS — Z79899 Other long term (current) drug therapy: Secondary | ICD-10-CM | POA: Diagnosis not present

## 2017-11-19 DIAGNOSIS — Z8541 Personal history of malignant neoplasm of cervix uteri: Secondary | ICD-10-CM | POA: Insufficient documentation

## 2017-11-19 DIAGNOSIS — O26892 Other specified pregnancy related conditions, second trimester: Secondary | ICD-10-CM | POA: Diagnosis not present

## 2017-11-19 DIAGNOSIS — O210 Mild hyperemesis gravidarum: Secondary | ICD-10-CM | POA: Insufficient documentation

## 2017-11-19 DIAGNOSIS — R112 Nausea with vomiting, unspecified: Secondary | ICD-10-CM | POA: Diagnosis present

## 2017-11-19 LAB — COMPREHENSIVE METABOLIC PANEL
ALT: 44 U/L (ref 14–54)
ANION GAP: 10 (ref 5–15)
AST: 34 U/L (ref 15–41)
Albumin: 3.9 g/dL (ref 3.5–5.0)
Alkaline Phosphatase: 43 U/L (ref 38–126)
BUN: 7 mg/dL (ref 6–20)
CO2: 22 mmol/L (ref 22–32)
Calcium: 9.1 mg/dL (ref 8.9–10.3)
Chloride: 101 mmol/L (ref 101–111)
Creatinine, Ser: 0.48 mg/dL (ref 0.44–1.00)
GFR calc non Af Amer: >60 mL/min (ref >60)
Glucose, Bld: 91 mg/dL (ref 65–99)
POTASSIUM: 3.9 mmol/L (ref 3.5–5.1)
SODIUM: 133 mmol/L — AB (ref 135–145)
Total Bilirubin: 0.7 mg/dL (ref 0.3–1.2)
Total Protein: 7.6 g/dL (ref 6.5–8.1)

## 2017-11-19 LAB — CBC
HEMATOCRIT: 37.5 % (ref 35.0–47.0)
HEMOGLOBIN: 12.4 g/dL (ref 12.0–16.0)
MCH: 27.6 pg (ref 26.0–34.0)
MCHC: 33.1 g/dL (ref 32.0–36.0)
MCV: 83.4 fL (ref 80.0–100.0)
Platelets: 371 10*3/uL (ref 150–440)
RBC: 4.5 MIL/uL (ref 3.80–5.20)
RDW: 13.9 % (ref 11.5–14.5)
WBC: 13.5 10*3/uL — ABNORMAL HIGH (ref 3.6–11.0)

## 2017-11-19 LAB — URINALYSIS, COMPLETE (UACMP) WITH MICROSCOPIC
BACTERIA UA: NONE SEEN
BILIRUBIN URINE: NEGATIVE
Glucose, UA: NEGATIVE mg/dL
HGB URINE DIPSTICK: NEGATIVE
KETONES UR: 80 mg/dL — AB
LEUKOCYTES UA: NEGATIVE
NITRITE: NEGATIVE
Protein, ur: 30 mg/dL — AB
SPECIFIC GRAVITY, URINE: 1.023 (ref 1.005–1.030)
pH: 5 (ref 5.0–8.0)

## 2017-11-19 LAB — INFLUENZA PANEL BY PCR (TYPE A & B)
INFLAPCR: NEGATIVE
Influenza B By PCR: NEGATIVE

## 2017-11-19 MED ORDER — PROMETHAZINE HCL 25 MG/ML IJ SOLN
12.5000 mg | Freq: Four times a day (QID) | INTRAMUSCULAR | Status: DC | PRN
  Administered 2017-11-19: 12.5 mg via INTRAVENOUS
  Filled 2017-11-19: qty 1

## 2017-11-19 MED ORDER — DEXTROSE 5 % AND 0.45 % NACL IV BOLUS
1000.0000 mL | Freq: Once | INTRAVENOUS | Status: AC
  Administered 2017-11-19: 1000 mL via INTRAVENOUS
  Filled 2017-11-19: qty 1000

## 2017-11-19 NOTE — ED Notes (Signed)
Esign not working at this time, pt verbalized discharge instructions and has no questions at this time

## 2017-11-19 NOTE — ED Provider Notes (Signed)
[facility] Emergency Department Provider Note    None    (approximate)  I have reviewed the triage vital signs and the nursing notes.   HISTORY  Chief Complaint pregnant, flu like symptoms and vaginal pain    HPI Brenda Fox is a 27 y.o. female G4p3 with a history of present emesis gravidarum presents with 3 days of nausea vomiting decreased p.o. intake and pain right around her pubis that is been aching and mild in nature.  Denies any dysuria.  No hematuria.  No vaginal discharge or bleeding.  No diarrhea.  No fevers.  Has had some muscle aches and a cough and her daughter was recently diagnosed with the flu so she is worried that she has the flu.  Has been taking Phenergan at home without improvement in her symptoms.    Past Medical History:  Diagnosis Date  . Cancer of cervix [facility])    History reviewed. No pertinent family history. Past Surgical History:  Procedure Laterality Date  . procedure for cervical cancer     Patient Active Problem List   Diagnosis Date Noted  . Hyperemesis arising during pregnancy 10/21/2017      Prior to Admission medications   Medication Sig Start Date End Date Taking? Authorizing Provider  cephALEXin (KEFLEX) 500 MG capsule Take 1 capsule (500 mg total) by mouth 3 (three) times daily. Patient not taking: Reported on 10/31/2017 10/15/17   Hinda Kehr, MD  docusate sodium (COLACE) 100 MG capsule Take 1 capsule (100 mg total) by mouth daily. 10/22/17   McVey, Murray Hodgkins, CNM  ondansetron (ZOFRAN) 4 MG tablet Take 1 tablet (4 mg total) by mouth every 8 (eight) hours as needed for nausea or vomiting. 10/31/17   Lisa Roca, MD  ondansetron (ZOFRAN-ODT) 4 MG disintegrating tablet Take 1 tablet (4 mg total) by mouth every 6 (six) hours as needed for nausea or vomiting. 10/22/17   McVey, Murray Hodgkins, CNM  promethazine (PHENERGAN) 25 MG tablet Take 1 tablet (25 mg total) by mouth every 6 (six) hours as needed for nausea or  vomiting. 11/06/17   Harvest Dark, MD    Allergies Patient has no known allergies.    Social History Social History   Tobacco Use  . Smoking status: Never Smoker  . Smokeless tobacco: Never Used  Substance Use Topics  . Alcohol use: No  . Drug use: No    Review of Systems Patient denies headaches, rhinorrhea, blurry vision, numbness, shortness of breath, chest pain, edema, cough, abdominal pain, nausea, vomiting, diarrhea, dysuria, fevers, rashes or hallucinations unless otherwise stated above in HPI. ____________________________________________   PHYSICAL EXAM:  VITAL SIGNS: Vitals:   11/19/17 1532  BP: (!) 123/53  Pulse: 92  Resp: 18  Temp: 99 F (37.2 C)  SpO2: 100%    Constitutional: Alert and oriented. Well appearing and in no acute distress. Eyes: Conjunctivae are normal.  Head: Atraumatic. Nose: No congestion/rhinnorhea. Mouth/Throat: Mucous membranes are moist.   Neck: No stridor. Painless ROM.  Cardiovascular: Normal rate, regular rhythm. Grossly normal heart sounds.  Good peripheral circulation. Respiratory: Normal respiratory effort.  No retractions. Lungs CTAB. Gastrointestinal: Soft gravid and nontender in all four quadrants. No distention. No abdominal bruits. No CVA tenderness. Genitourinary:  Musculoskeletal: No lower extremity tenderness nor edema.  No joint effusions. Neurologic:  Normal speech and language. No gross focal neurologic deficits are appreciated. No facial droop Skin:  Skin is warm, dry and intact. No rash noted. Psychiatric: Mood and affect  are normal. Speech and behavior are normal.  ____________________________________________   LABS (all labs ordered are listed, but only abnormal results are displayed)  Results for orders placed or performed during the hospital encounter of 11/19/17 (from the past 24 hour(s))  Comprehensive metabolic panel     Status: Abnormal   Collection Time: 11/19/17  3:31 PM  Result Value Ref  Range   Sodium 133 (L) 135 - 145 mmol/L   Potassium 3.9 3.5 - 5.1 mmol/L   Chloride 101 101 - 111 mmol/L   CO2 22 22 - 32 mmol/L   Glucose, Bld 91 65 - 99 mg/dL   BUN 7 6 - 20 mg/dL   Creatinine, Ser 0.48 0.44 - 1.00 mg/dL   Calcium 9.1 8.9 - 10.3 mg/dL   Total Protein 7.6 6.5 - 8.1 g/dL   Albumin 3.9 3.5 - 5.0 g/dL   AST 34 15 - 41 U/L   ALT 44 14 - 54 U/L   Alkaline Phosphatase 43 38 - 126 U/L   Total Bilirubin 0.7 0.3 - 1.2 mg/dL   GFR calc non Af Amer >60 >60 mL/min   GFR calc Af Amer >60 >60 mL/min   Anion gap 10 5 - 15  CBC     Status: Abnormal   Collection Time: 11/19/17  3:31 PM  Result Value Ref Range   WBC 13.5 (H) 3.6 - 11.0 K/uL   RBC 4.50 3.80 - 5.20 MIL/uL   Hemoglobin 12.4 12.0 - 16.0 g/dL   HCT 37.5 35.0 - 47.0 %   MCV 83.4 80.0 - 100.0 fL   MCH 27.6 26.0 - 34.0 pg   MCHC 33.1 32.0 - 36.0 g/dL   RDW 13.9 11.5 - 14.5 %   Platelets 371 150 - 440 K/uL  Influenza panel by PCR (type A & B)     Status: None   Collection Time: 11/19/17  3:31 PM  Result Value Ref Range   Influenza A By PCR NEGATIVE NEGATIVE   Influenza B By PCR NEGATIVE NEGATIVE   ____________________________________________  EKG____________________________________________  RADIOLOGY  EMERGENCY DEPARTMENT [place] PREGNANCY "Study: Limited Ultrasound of the Pelvis for Pregnancy"  INDICATIONS:Pregnancy(required) Multiple views of the uterus and pelvic cavity were obtained in real-time with a multi-frequency probe.  APPROACH:Transabdominal  PERFORMED BY: Myself IMAGES ARCHIVED?: Yes LIMITATIONS: none PREGNANCY FREE FLUID: Present ADNEXAL FINDINGS: GESTATIONAL AGE, ESTIMATE:  FETAL HEART RATE: 155 INTERPRETATION: reassuring fetal movement and FHR     ____________________________________________   PROCEDURES  Procedure(s) performed:  Procedures  Due to difficulty with obtaining IV access, a 20G peripheral IV catheter was inserted using US guidance into the right ac.  The site was  prepped with chlorhexidine and allowed to dry.  The patient tolerated the procedure without any complications.   Critical Care performed: no ____________________________________________   INITIAL IMPRESSION / ASSESSMENT AND PLAN / ED COURSE  Pertinent labs & imaging results that were available during my care of the patient were reviewed by me and considered in my medical decision making (see chart for details).  DDX: Hyperemesis, flu, appendicitis, torsion, round ligament pain, UTI  Brenda Fox is a 27 y.o. who presents to the ED with symptoms as described above.  Pain is most consistent with round ligament pain.  Nausea and vomiting likely secondary to persistent hyperemesis.  Denies any dysuria or fevers.  Her flu is negative.  Bedside ultrasound is reassuring.  Her abdominal exam is otherwise soft and benign.  This not clinically consistent with acute abdomen or  acute appendicitis or torsion.  Patient will be given IV fluids for her nausea vomiting as well as IV antiemetic.  Patient established with outpatient follow-up and is appropriate for discharge home.  Have discussed with the patient and available family all diagnostics and treatments performed thus far and all questions were answered to the best of my ability. The patient demonstrates understanding and agreement with plan.       ____________________________________________   FINAL CLINICAL IMPRESSION(S) / ED DIAGNOSES  Final diagnoses:  Hyperemesis gravidarum  Pelvic pain in pregnancy, antepartum, second trimester      NEW MEDICATIONS STARTED DURING THIS VISIT:  New Prescriptions   No medications on file     Note:  This document was prepared using Dragon voice recognition software and may include unintentional dictation errors.    Merlyn Lot, MD 11/19/17 [phone]

## 2017-11-19 NOTE — ED Triage Notes (Addendum)
Here for flu like symptoms such as cough, body aches, and congestion.  Has had vomiting but has had problems her whole pregnancy with this.  Reports has thrown up 6 times today.  Also c/o pain to vagina.  Here multiple times for pregnancy related problems.  No vomiting in triage. Daughter had flu. No vaginal bleeding.

## 2017-12-08 LAB — OB RESULTS CONSOLE RUBELLA ANTIBODY, IGM: Rubella: IMMUNE

## 2017-12-12 LAB — OB RESULTS CONSOLE HEPATITIS B SURFACE ANTIGEN: Hepatitis B Surface Ag: NEGATIVE

## 2017-12-12 LAB — OB RESULTS CONSOLE VARICELLA ZOSTER ANTIBODY, IGG: Varicella: IMMUNE

## 2017-12-12 LAB — OB RESULTS CONSOLE HIV ANTIBODY (ROUTINE TESTING): HIV: NONREACTIVE

## 2017-12-17 ENCOUNTER — Other Ambulatory Visit: Payer: Self-pay | Admitting: Certified Nurse Midwife

## 2017-12-17 DIAGNOSIS — Z3689 Encounter for other specified antenatal screening: Secondary | ICD-10-CM

## 2017-12-29 ENCOUNTER — Observation Stay
Admission: EM | Admit: 2017-12-29 | Discharge: 2017-12-30 | Disposition: A | Discharge status: Home / Self Care | Payer: Medicaid Other | Attending: Obstetrics and Gynecology | Admitting: Obstetrics and Gynecology

## 2017-12-29 ENCOUNTER — Other Ambulatory Visit: Payer: Self-pay

## 2017-12-29 DIAGNOSIS — Z3490 Encounter for supervision of normal pregnancy, unspecified, unspecified trimester: Secondary | ICD-10-CM

## 2017-12-29 DIAGNOSIS — Z3A20 20 weeks gestation of pregnancy: Secondary | ICD-10-CM | POA: Diagnosis not present

## 2017-12-29 DIAGNOSIS — W19XXXA Unspecified fall, initial encounter: Secondary | ICD-10-CM | POA: Diagnosis not present

## 2017-12-29 DIAGNOSIS — O26892 Other specified pregnancy related conditions, second trimester: Principal | ICD-10-CM | POA: Insufficient documentation

## 2017-12-29 DIAGNOSIS — R109 Unspecified abdominal pain: Secondary | ICD-10-CM | POA: Insufficient documentation

## 2017-12-29 NOTE — OB Triage Note (Signed)
Discharge instructions provided and reviewed.  Follow up care discussed.  Discussed taking Tylenol PRN for pain.  Pt verbalized understanding.

## 2017-12-29 NOTE — OB Triage Note (Signed)
Pt arrival to triage for c/o abdominal pressure post fall around 2100.  Pt denies ctxns, vaginal bleeding, and LOF.  Pt feels baby moving normally.  Doppler FHR tone assessed and toco applied.

## 2017-12-30 NOTE — Discharge Summary (Signed)
  20+3 weeks fell on hand and knees and was concerned . No vaginal bleeding , + fetal movt  Intermittent fetal monitoring nl  D/c home

## 2018-02-06 ENCOUNTER — Observation Stay
Admission: EM | Admit: 2018-02-06 | Discharge: 2018-02-06 | Disposition: A | Discharge status: Home / Self Care | Payer: Medicaid Other | Attending: Obstetrics and Gynecology | Admitting: Obstetrics and Gynecology

## 2018-02-06 DIAGNOSIS — Z8541 Personal history of malignant neoplasm of cervix uteri: Secondary | ICD-10-CM | POA: Insufficient documentation

## 2018-02-06 DIAGNOSIS — O4702 False labor before 37 completed weeks of gestation, second trimester: Principal | ICD-10-CM | POA: Insufficient documentation

## 2018-02-06 DIAGNOSIS — Z79899 Other long term (current) drug therapy: Secondary | ICD-10-CM | POA: Diagnosis not present

## 2018-02-06 DIAGNOSIS — Z3A25 25 weeks gestation of pregnancy: Secondary | ICD-10-CM | POA: Insufficient documentation

## 2018-02-06 LAB — URINALYSIS, ROUTINE W REFLEX MICROSCOPIC
BILIRUBIN URINE: NEGATIVE
GLUCOSE, UA: NEGATIVE mg/dL
HGB URINE DIPSTICK: NEGATIVE
KETONES UR: NEGATIVE mg/dL
Leukocytes, UA: NEGATIVE
Nitrite: NEGATIVE
PROTEIN: NEGATIVE mg/dL
Specific Gravity, Urine: 1.023 (ref 1.005–1.030)
pH: 5 (ref 5.0–8.0)

## 2018-02-06 LAB — WET PREP, GENITAL
Clue Cells Wet Prep HPF POC: NONE SEEN
SPERM: NONE SEEN
TRICH WET PREP: NONE SEEN
YEAST WET PREP: NONE SEEN

## 2018-02-06 LAB — CHLAMYDIA/NGC RT PCR (ARMC ONLY)
CHLAMYDIA TR: NOT DETECTED
N GONORRHOEAE: NOT DETECTED

## 2018-02-06 LAB — FETAL FIBRONECTIN: Fetal Fibronectin: NEGATIVE

## 2018-02-06 NOTE — Progress Notes (Signed)
Pt presents with complaint of lower abdominal pain that started last night and around 3am. Pt states that the pain has eased up today but is still occurring. Denies bleeding, leakage of fluid, urinary frequency or burning. States that she has a little bit of nausea but has had it all during pregnancy.
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
## 2018-02-08 LAB — CULTURE, OB URINE
Culture: NO GROWTH
SPECIAL REQUESTS: NORMAL

## 2018-03-18 ENCOUNTER — Encounter: Payer: Self-pay | Admitting: Dietician

## 2018-03-18 ENCOUNTER — Encounter: Payer: Medicaid Other | Attending: Obstetrics and Gynecology | Admitting: Dietician

## 2018-03-18 ENCOUNTER — Other Ambulatory Visit: Payer: Self-pay | Admitting: *Deleted

## 2018-03-18 VITALS — BP 112/78 | Ht 65.0 in | Wt 191.4 lb

## 2018-03-18 DIAGNOSIS — Z3A00 Weeks of gestation of pregnancy not specified: Secondary | ICD-10-CM | POA: Diagnosis not present

## 2018-03-18 DIAGNOSIS — Z0489 Encounter for examination and observation for other specified reasons: Secondary | ICD-10-CM

## 2018-03-18 DIAGNOSIS — O24410 Gestational diabetes mellitus in pregnancy, diet controlled: Secondary | ICD-10-CM | POA: Diagnosis not present

## 2018-03-18 DIAGNOSIS — Z713 Dietary counseling and surveillance: Secondary | ICD-10-CM | POA: Diagnosis not present

## 2018-03-18 DIAGNOSIS — IMO0002 Reserved for concepts with insufficient information to code with codable children: Secondary | ICD-10-CM

## 2018-03-18 NOTE — Progress Notes (Signed)
Appt. Start Time: 1330 Appt. End Time: 1500  GDM Class 1 Diabetes Overview - define DM; state own type of DM; identify functions of pancreas and insulin; define insulin deficiency vs insulin resistance  Psychosocial - identify DM as a source of stress; state the effects of stress on BG control; verbalize appropriate stress management techniques; identify personal stress issues   Nutritional Management - describe effects of food on blood glucose; identify sources of carbohydrate, protein and fat; verbalize the importance of balance meals in controlling blood glucose; identify meals as well balanced or not; estimate servings of carbohydrate from menus; use food labels to identify servings size, content of carbohydrate, fiber, protein, fat, saturated fat and sodium; recognize food sources of fat, saturated fat, trans fat, sodium and verbalize goals for intake; describe healthful appropriate food choices when dining out   Exercise - describe the effects of exercise on blood glucose and importance of regular exercise in controlling diabetes; state a plan for personal exercise; verbalize contraindications for exercise  Medications - state name, dose, timing of currently prescribed medications; describe types of medications available for diabetes  Insulin Training - prepare and administer insulin accurately; state correct rotation pattern; verbalize safe and lawful needle disposal  Self-Monitoring - state importance of HBGM and demo procedure accurately; use HBGM results to effectively manage diabetes; identify importance of regular HbA1C testing and goals for results  Acute Complications/Sick Day Guidelines - recognize hyperglycemia and hypoglycemia with causes and effects; identify blood glucose results as high, low or in control; list steps in treating and preventing high and low blood glucose; state appropriate measure to manage blood glucose when ill (need for meds, HBGM plan, when to call physician,  need for fluids)  Chronic Complications/Foot, Skin, Eye Dental Care - identify possible long-term complications of diabetes (retinopathy, neuropathy, nephropathy, cardiovascular disease, infections); explain steps in prevention and treatment of chronic complications; state importance of daily self-foot exams; describe how to examine feet and what to look for; explain appropriate eye and dental care  Lifestyle Changes/Goals & Health/Community Resources - state benefits of making appropriate lifestyle changes; identify habits that need to change (meals, tobacco, alcohol); identify strategies to reduce risk factors for personal health; set goals for proper diabetes care; state need for and frequency of healthcare follow-up; describe appropriate community resources for good health (ADA, web sites, apps)   Pregnancy/Sexual Health - define gestational diabetes; state importance of good blood glucose control and birth control prior to pregnancy; state importance of good blood glucose control in preventing sexual problems (impotence, vaginal dryness, infections, loss of desire); state relationship of blood glucose control and pregnancy outcome; describe risk of maternal and fetal complications  Teaching Materials Used: Meter-Accuchek Guide test strips General Meal Planning Guidelines Daily Food Record Gestational Diabetes Booklet Gestational Video Goals for Healthy Pregnancy

## 2018-03-18 NOTE — Patient Instructions (Signed)
Read booklet on Gestational Diabetes Follow Gestational Meal Planning Guidelines Complete a 3 Day Food Record and bring to next appointment Check blood sugars 4 x day - before breakfast and 2 hrs after every meal and record  Bring blood sugar log to all appointments Call MD for prescription for meter strips and lancets Strips: Accuchek Guide test strips  Lancets:  Accuchek Fast Clix lancets Walk 20-30 minutes at least 5 x week if permitted by MD Next appointment    03-25-18

## 2018-03-25 ENCOUNTER — Encounter: Payer: Self-pay | Admitting: Dietician

## 2018-03-25 ENCOUNTER — Encounter: Payer: Medicaid Other | Admitting: Dietician

## 2018-03-25 VITALS — BP 100/54 | Ht 65.0 in | Wt 192.0 lb

## 2018-03-25 DIAGNOSIS — O24410 Gestational diabetes mellitus in pregnancy, diet controlled: Secondary | ICD-10-CM

## 2018-03-25 NOTE — Patient Instructions (Signed)
   Control portions of starches like rice and pasta carefully. If you eat 1 cup, then leave off bread, tortillas, fruit, or any other carbs. If you want bread or tortilla, eat a smaller portion of rice or pasta.   Try having a protein food with fruit before bedtime to see if it help with better sleep.   Plan to eat a full balanced meal at lunchtime to keep from being too hungry at suppertime.   Can also try walking for about 10 minutes after supper to help control blood sugar better in the evening.

## 2018-03-25 NOTE — Progress Notes (Signed)
   Patient's BG record indicates fasting BGs are within goal range, as well as post-breakfast and lunch. After-dinner BGs are often above goal, ranging 93-136 + one reading of 156.  Patient reports sleeping poorly for the past several nights, sometimes unable to sleep restfully until about 4am, then sleeps until about 11am. She is testing fasting BG at 11am.   Patient's food diary indicates some high carbohydrate meals, although patient states portions are smaller than what she was eating prior to diagnosis with diabetes.  She reports strong hunger symptoms at suppertime and does eat a larger supper meal.   Provided 1900kcal meal plan, and wrote individualized menus based on patient's food preferences. Advised controlled carbohydrate portions, and increasing protein and vegetable portions for fulness. She will work on eating more at lunch and/or pm snack to control evening hunger.   Instructed patient on food safety, including avoidance of Listeriosis, and limiting mercury from fish.  Discussed importance of maintaining healthy lifestyle habits to reduce risk of Type 2 DM as well as Gestational DM with any future pregnancies.  Advised patient to use any remaining testing supplies to test some BGs after delivery, and to have BG tested ideally annually, as well as prior to attempting future pregnancies.

## 2018-03-28 ENCOUNTER — Ambulatory Visit: Payer: Medicaid Other

## 2018-04-16 LAB — OB RESULTS CONSOLE GC/CHLAMYDIA
Chlamydia: NEGATIVE
Gonorrhea: NEGATIVE

## 2018-04-16 LAB — OB RESULTS CONSOLE RPR: RPR: NONREACTIVE

## 2018-04-16 LAB — OB RESULTS CONSOLE GBS: STREP GROUP B AG: NEGATIVE

## 2018-04-29 ENCOUNTER — Inpatient Hospital Stay
Admission: EM | Admit: 2018-04-29 | Discharge: 2018-05-01 | DRG: 806 | Disposition: A | Discharge status: Home / Self Care | Payer: Medicaid Other | Attending: Obstetrics and Gynecology | Admitting: Obstetrics and Gynecology

## 2018-04-29 ENCOUNTER — Encounter: Payer: Self-pay | Admitting: *Deleted

## 2018-04-29 ENCOUNTER — Inpatient Hospital Stay: Payer: Medicaid Other | Admitting: Registered Nurse

## 2018-04-29 ENCOUNTER — Other Ambulatory Visit: Payer: Self-pay

## 2018-04-29 DIAGNOSIS — E669 Obesity, unspecified: Secondary | ICD-10-CM | POA: Diagnosis present

## 2018-04-29 DIAGNOSIS — O99214 Obesity complicating childbirth: Secondary | ICD-10-CM | POA: Diagnosis present

## 2018-04-29 DIAGNOSIS — Z3A37 37 weeks gestation of pregnancy: Secondary | ICD-10-CM

## 2018-04-29 DIAGNOSIS — O24425 Gestational diabetes mellitus in childbirth, controlled by oral hypoglycemic drugs: Secondary | ICD-10-CM | POA: Diagnosis present

## 2018-04-29 DIAGNOSIS — O358XX0 Maternal care for other (suspected) fetal abnormality and damage, not applicable or unspecified: Secondary | ICD-10-CM | POA: Diagnosis present

## 2018-04-29 DIAGNOSIS — D62 Acute posthemorrhagic anemia: Secondary | ICD-10-CM | POA: Diagnosis not present

## 2018-04-29 DIAGNOSIS — O9081 Anemia of the puerperium: Secondary | ICD-10-CM | POA: Diagnosis not present

## 2018-04-29 LAB — CBC
HCT: 37.2 % (ref 35.0–47.0)
Hemoglobin: 12.4 g/dL (ref 12.0–16.0)
MCH: 27.2 pg (ref 26.0–34.0)
MCHC: 33.4 g/dL (ref 32.0–36.0)
MCV: 81.3 fL (ref 80.0–100.0)
PLATELETS: 290 10*3/uL (ref 150–440)
RBC: 4.58 MIL/uL (ref 3.80–5.20)
RDW: 15.5 % — ABNORMAL HIGH (ref 11.5–14.5)
WBC: 14.1 10*3/uL — AB (ref 3.6–11.0)

## 2018-04-29 LAB — COMPREHENSIVE METABOLIC PANEL
ALT: 17 U/L (ref 0–44)
AST: 21 U/L (ref 15–41)
Albumin: 3.3 g/dL — ABNORMAL LOW (ref 3.5–5.0)
Alkaline Phosphatase: 149 U/L — ABNORMAL HIGH (ref 38–126)
Anion gap: 8 (ref 5–15)
BILIRUBIN TOTAL: 0.5 mg/dL (ref 0.3–1.2)
BUN: 15 mg/dL (ref 6–20)
CHLORIDE: 110 mmol/L (ref 98–111)
CO2: 19 mmol/L — ABNORMAL LOW (ref 22–32)
CREATININE: 0.61 mg/dL (ref 0.44–1.00)
Calcium: 9.4 mg/dL (ref 8.9–10.3)
Glucose, Bld: 98 mg/dL (ref 70–99)
Potassium: 4 mmol/L (ref 3.5–5.1)
Sodium: 137 mmol/L (ref 135–145)
TOTAL PROTEIN: 7.2 g/dL (ref 6.5–8.1)

## 2018-04-29 LAB — GLUCOSE, CAPILLARY: GLUCOSE-CAPILLARY: 79 mg/dL (ref 70–99)

## 2018-04-29 LAB — TYPE AND SCREEN
ABO/RH(D): A POS
Antibody Screen: NEGATIVE

## 2018-04-29 MED ORDER — LIDOCAINE HCL (PF) 1 % IJ SOLN
INTRAMUSCULAR | Status: DC | PRN
  Administered 2018-04-29: 3 mL via SUBCUTANEOUS

## 2018-04-29 MED ORDER — AMMONIA AROMATIC IN INHA
RESPIRATORY_TRACT | Status: AC
  Filled 2018-04-29: qty 10

## 2018-04-29 MED ORDER — TERBUTALINE SULFATE 1 MG/ML IJ SOLN: 0.2500 mg | Freq: Once | INTRAMUSCULAR | Status: DC | PRN

## 2018-04-29 MED ORDER — EPHEDRINE 5 MG/ML INJ
10.0000 mg | INTRAVENOUS | Status: DC | PRN
  Filled 2018-04-29: qty 2

## 2018-04-29 MED ORDER — BUPIVACAINE HCL (PF) 0.25 % IJ SOLN
INTRAMUSCULAR | Status: DC | PRN
  Administered 2018-04-29: 3 mL via EPIDURAL
  Administered 2018-04-29: 5 mL via EPIDURAL

## 2018-04-29 MED ORDER — MISOPROSTOL 200 MCG PO TABS
ORAL_TABLET | ORAL | Status: AC
  Filled 2018-04-29: qty 4

## 2018-04-29 MED ORDER — OXYTOCIN 40 UNITS IN LACTATED RINGERS INFUSION - SIMPLE MED
1.0000 m[IU]/min | INTRAVENOUS | Status: DC
  Administered 2018-04-29: 2 m[IU]/min via INTRAVENOUS

## 2018-04-29 MED ORDER — ONDANSETRON HCL 4 MG/2ML IJ SOLN: 4.0000 mg | Freq: Four times a day (QID) | INTRAMUSCULAR | Status: DC | PRN

## 2018-04-29 MED ORDER — PHENYLEPHRINE 40 MCG/ML (10ML) SYRINGE FOR IV PUSH (FOR BLOOD PRESSURE SUPPORT)
80.0000 ug | PREFILLED_SYRINGE | INTRAVENOUS | Status: DC | PRN
  Filled 2018-04-29: qty 5

## 2018-04-29 MED ORDER — LACTATED RINGERS IV SOLN
500.0000 mL | Freq: Once | INTRAVENOUS | Status: AC
  Administered 2018-04-29: 500 mL via INTRAVENOUS

## 2018-04-29 MED ORDER — LACTATED RINGERS IV SOLN: INTRAVENOUS | Status: DC

## 2018-04-29 MED ORDER — ACETAMINOPHEN 325 MG PO TABS: 650.0000 mg | ORAL_TABLET | ORAL | Status: DC | PRN

## 2018-04-29 MED ORDER — FENTANYL 2.5 MCG/ML W/ROPIVACAINE 0.15% IN NS 100 ML EPIDURAL (ARMC)
EPIDURAL | Status: DC | PRN
  Administered 2018-04-29: 12 mL/h via EPIDURAL

## 2018-04-29 MED ORDER — ACETAMINOPHEN 325 MG PO TABS: 650.0000 mg | ORAL_TABLET | Freq: Four times a day (QID) | ORAL | Status: DC | PRN

## 2018-04-29 MED ORDER — LIDOCAINE HCL (PF) 1 % IJ SOLN: 30.0000 mL | INTRAMUSCULAR | Status: DC | PRN

## 2018-04-29 MED ORDER — OXYTOCIN 40 UNITS IN LACTATED RINGERS INFUSION - SIMPLE MED
2.5000 [IU]/h | INTRAVENOUS | Status: DC
  Administered 2018-04-29: 2.5 [IU]/h via INTRAVENOUS

## 2018-04-29 MED ORDER — LIDOCAINE HCL (PF) 1 % IJ SOLN
INTRAMUSCULAR | Status: AC
  Filled 2018-04-29: qty 30

## 2018-04-29 MED ORDER — SOD CITRATE-CITRIC ACID 500-334 MG/5ML PO SOLN: 30.0000 mL | ORAL | Status: DC | PRN

## 2018-04-29 MED ORDER — OXYTOCIN BOLUS FROM INFUSION: 500.0000 mL | Freq: Once | INTRAVENOUS | Status: DC

## 2018-04-29 MED ORDER — DIPHENHYDRAMINE HCL 50 MG/ML IJ SOLN: 12.5000 mg | INTRAMUSCULAR | Status: DC | PRN

## 2018-04-29 MED ORDER — LACTATED RINGERS IV SOLN: 500.0000 mL | INTRAVENOUS | Status: DC | PRN

## 2018-04-29 MED ORDER — ACETAMINOPHEN 650 MG RE SUPP
650.0000 mg | Freq: Four times a day (QID) | RECTAL | Status: DC | PRN
  Filled 2018-04-29: qty 1

## 2018-04-29 MED ORDER — FENTANYL 2.5 MCG/ML W/ROPIVACAINE 0.15% IN NS 100 ML EPIDURAL (ARMC)
EPIDURAL | Status: AC
  Filled 2018-04-29: qty 100

## 2018-04-29 MED ORDER — BUTORPHANOL TARTRATE 1 MG/ML IJ SOLN: 1.0000 mg | INTRAMUSCULAR | Status: DC | PRN

## 2018-04-29 MED ORDER — BUTORPHANOL TARTRATE 2 MG/ML IJ SOLN
1.0000 mg | INTRAMUSCULAR | Status: DC | PRN
  Administered 2018-04-29: 2 mg via INTRAVENOUS
  Filled 2018-04-29: qty 1

## 2018-04-29 MED ORDER — LACTATED RINGERS IV SOLN
INTRAVENOUS | Status: DC
  Administered 2018-04-29 (×2): via INTRAVENOUS

## 2018-04-29 MED ORDER — FENTANYL 2.5 MCG/ML W/ROPIVACAINE 0.15% IN NS 100 ML EPIDURAL (ARMC): 12.0000 mL/h | EPIDURAL | Status: DC

## 2018-04-29 MED ORDER — OXYTOCIN 40 UNITS IN LACTATED RINGERS INFUSION - SIMPLE MED
INTRAVENOUS | Status: AC
  Administered 2018-04-29: 2 m[IU]/min via INTRAVENOUS
  Filled 2018-04-29: qty 1000

## 2018-04-29 MED ORDER — LIDOCAINE-EPINEPHRINE (PF) 1.5 %-1:200000 IJ SOLN
INTRAMUSCULAR | Status: DC | PRN
  Administered 2018-04-29: 3 mL via EPIDURAL

## 2018-04-29 NOTE — Discharge Instructions (Signed)
Vaginal Delivery, Care After °Refer to this sheet in the next few weeks. These instructions provide you with information about caring for yourself after vaginal delivery. Your health care provider may also give you more specific instructions. Your treatment has been planned according to current medical practices, but problems sometimes occur. Call your health care provider if you have any problems or questions. °What can I expect after the procedure? °After vaginal delivery, it is common to have: °· Some bleeding from your vagina. °· Soreness in your abdomen, your vagina, and the area of skin between your vaginal opening and your anus (perineum). °· Pelvic cramps. °· Fatigue. ° °Follow these instructions at home: °Medicines °· Take over-the-counter and prescription medicines only as told by your health care provider. °· If you were prescribed an antibiotic medicine, take it as told by your health care provider. Do not stop taking the antibiotic until it is finished. °Driving ° °· Do not drive or operate heavy machinery while taking prescription pain medicine. °· Do not drive for 24 hours if you received a sedative. °Lifestyle °· Do not drink alcohol. This is especially important if you are breastfeeding or taking medicine to relieve pain. °· Do not use tobacco products, including cigarettes, chewing tobacco, or e-cigarettes. If you need help quitting, ask your health care provider. °Eating and drinking °· Drink at least 8 eight-ounce glasses of water every day unless you are told not to by your health care provider. If you choose to breastfeed your baby, you may need to drink more water than this. °· Eat high-fiber foods every day. These foods may help prevent or relieve constipation. High-fiber foods include: °? Whole grain cereals and breads. °? Brown rice. °? Beans. °? Fresh fruits and vegetables. °Activity °· Return to your normal activities as told by your health care provider. Ask your health care provider  what activities are safe for you. °· Rest as much as possible. Try to rest or take a nap when your baby is sleeping. °· Do not lift anything that is heavier than your baby or 10 lb (4.5 kg) until your health care provider says that it is safe. °· Talk with your health care provider about when you can engage in sexual activity. This may depend on your: °? Risk of infection. °? Rate of healing. °? Comfort and desire to engage in sexual activity. °Vaginal Care °· If you have an episiotomy or a vaginal tear, check the area every day for signs of infection. Check for: °? More redness, swelling, or pain. °? More fluid or blood. °? Warmth. °? Pus or a bad smell. °· Do not use tampons or douches until your health care provider says this is safe. °· Watch for any blood clots that may pass from your vagina. These may look like clumps of dark red, brown, or black discharge. °General instructions °· Keep your perineum clean and dry as told by your health care provider. °· Wear loose, comfortable clothing. °· Wipe from front to back when you use the toilet. °· Ask your health care provider if you can shower or take a bath. If you had an episiotomy or a perineal tear during labor and delivery, your health care provider may tell you not to take baths for a certain length of time. °· Wear a bra that supports your breasts and fits you well. °· If possible, have someone help you with household activities and help care for your baby for at least a few days after   you leave the hospital. °· Keep all follow-up visits for you and your baby as told by your health care provider. This is important. °Contact a health care provider if: °· You have: °? Vaginal discharge that has a bad smell. °? Difficulty urinating. °? Pain when urinating. °? A sudden increase or decrease in the frequency of your bowel movements. °? More redness, swelling, or pain around your episiotomy or vaginal tear. °? More fluid or blood coming from your episiotomy or  vaginal tear. °? Pus or a bad smell coming from your episiotomy or vaginal tear. °? A fever. °? A rash. °? Little or no interest in activities you used to enjoy. °? Questions about caring for yourself or your baby. °· Your episiotomy or vaginal tear feels warm to the touch. °· Your episiotomy or vaginal tear is separating or does not appear to be healing. °· Your breasts are painful, hard, or turn red. °· You feel unusually sad or worried. °· You feel nauseous or you vomit. °· You pass large blood clots from your vagina. If you pass a blood clot from your vagina, save it to show to your health care provider. Do not flush blood clots down the toilet without having your health care provider look at them. °· You urinate more than usual. °· You are dizzy or light-headed. °· You have not breastfed at all and you have not had a menstrual period for 12 weeks after delivery. °· You have stopped breastfeeding and you have not had a menstrual period for 12 weeks after you stopped breastfeeding. °Get help right away if: °· You have: °? Pain that does not go away or does not get better with medicine. °? Chest pain. °? Difficulty breathing. °? Blurred vision or spots in your vision. °? Thoughts about hurting yourself or your baby. °· You develop pain in your abdomen or in one of your legs. °· You develop a severe headache. °· You faint. °· You bleed from your vagina so much that you fill two sanitary pads in one hour. °This information is not intended to replace advice given to you by your health care provider. Make sure you discuss any questions you have with your health care provider. °Document Released: 09/01/2000 Document Revised: 02/16/2016 Document Reviewed: 09/19/2015 °Elsevier Interactive Patient Education © 2018 [employer] ° °

## 2018-04-29 NOTE — Discharge Summary (Signed)
Obstetrical Discharge Summary  Patient Name: Brenda Fox DOB: 1990/09/21 MRN: [id number]  Date of Admission: 04/29/2018 Date of Delivery:04/29/18 Delivered by: Donia Guiles CNM Date of Discharge: 05/01/2018  Primary OB: [facility] [id number] last menstrual period was 08/24/2017. EDC Estimated Date of Delivery: 05/16/18 Gestational Age at Delivery: [redacted]w[redacted]d   Antepartum complications: M1DQQ who could not do Insulin due to issues and was maintained on Glyburide 2.5 mg daily with improved sugars towards the end of pregnancy  Admitting Diagnosis: IUP at 37 4/7 weeks in early labor Secondary Diagnosis:  1. A2GDM,  2. fetal pelviectasis and elevated AFP which was cleared by [place] [facility] 3. SVD, no lacerations  Patient Active Problem List   Diagnosis Date Noted  . Indication for care in labor and delivery, antepartum 04/29/2018  . Preterm uterine contractions in second trimester, antepartum 02/06/2018  . Pregnancy 12/29/2017  . Hyperemesis arising during pregnancy 10/21/2017  LEEP hx with scar tissue due to cervical cancer  Augmentation: Pitocin per protocol Complications: Occs variable decel with poor uterine contraction tracing but made adequate progress Intrapartum complications/course: Pt had LEEP and had a pinpoint cx due to scar tissue, after exam of the area, inserted digit and the scar tissue popped to 6 cms all at once Date of Delivery: 04/29/18 Delivered By: Glenis Smoker, CNM Delivery Type: NSVD Anesthesia: epidural Placenta: spontaneous Laceration: None Episiotomy: none Newborn Data: Live born female  Birth Weight: 6#15 oz/3170gms APGAR: 9, 9  Newborn Delivery   Birth date/time:  04/29/2018 21:23:00 Delivery type:  Vaginal, Spontaneous     Postpartum Procedures: None  Post partum course: Stable Patient had an uncomplicated postpartum course.  By time of discharge on PPD#2, her pain was controlled on oral pain medications; she had appropriate lochia and was ambulating,  voiding without difficulty and tolerating regular diet.  She was deemed stable for discharge to home.     Discharge Physical Exam:  BP 103/69 (BP Location: Right Arm)   Pulse 81   Temp 97.6 F (36.4 C) (Oral)   Resp 18   Ht 5\' 5"  (1.651 m)   Wt 90.3 kg   LMP 08/24/2017   SpO2 100%   Breastfeeding? Unknown   BMI 33.12 kg/m   General: NAD CV: RRR Pulm: CTABL, nl effort ABD: s/nd/nt, fundus firm and below the umbilicus Lochia: small Incision: None DVT Evaluation: LE non-ttp, no evidence of DVT on exam.  Hemoglobin  Date Value Ref Range Status  04/30/2018 10.7 (L) 12.0 - 16.0 g/dL Final   HGB  Date Value Ref Range Status  12/01/2012 13.6 12.0 - 16.0 g/dL Final   HCT  Date Value Ref Range Status  04/30/2018 31.8 (L) 35.0 - 47.0 % Final  12/01/2012 40.8 35.0 - 47.0 % Final     Disposition: stable, discharge to home. Baby Feeding: breastmilk & formula Baby Disposition: home with mom  Rh Immune globulin given: N/A Rubella vaccine given: Immune Tdap vaccine given in AP or PP setting: 02/28/18 Flu vaccine given in AP or PP setting: N/A  Contraception: desires NExplanon  Prenatal Labs:   RPR Non Reactive        Rubella  Immune   1.89     Type  A pos   A     Varicella  Immune    200     Antibody  Neg   Negative     AFP DSR 1:749 for age   1:10,000 *Screen Positive* (A) Not increased for Trisomy 18   GBS neg, pt  seen at [facility] for elevated AFP and pelviectasis   Plan:  Brenda Fox was discharged to home in good condition. Follow-up appointment with delivering provider in 6 weeks.  Discharge Medications:PNV, Fe, Ibuprofen   Follow-up Information    Catheryn Bacon, CNM. Schedule an appointment as soon as possible for a visit in 6 week(s).   Specialty:  Obstetrics and Gynecology Contact information: [place] [address] [phone]           ___________________________________  Signed: Magda Kiel, REBECCA A, CNM 05/01/2018  10:34 AM

## 2018-04-29 NOTE — Progress Notes (Signed)
Rye Dorado is a 27 y.o. G4P1021 at [redacted]w[redacted]d by [place]  Subjective: "Comfortable  Objective: BP (!) 107/49   Pulse 91   Temp 98.1 F (36.7 C) (Oral)   Resp 16   Ht 5\' 5"  (1.651 m)   Wt 90.3 kg   LMP 08/24/2017   SpO2 99%   BMI 33.12 kg/m  I/O last 3 completed shifts: In: 957 [I.V.:957] Out: -  No intake/output data recorded.  FHT:  130, no decels, +accels, mod variability. Cat 1 UC:   irregular SVE:   Dilation: 6.5 Effacement (%): 90 Station: -1 Exam by:: Margarita Rana, RN  Labs: Lab Results  Component Value Date   WBC 14.1 (H) 04/29/2018   HGB 12.4 04/29/2018   HCT 37.2 04/29/2018   MCV 81.3 04/29/2018   PLT 290 04/29/2018    Assessment / Plan: A:1. IIUP at 37 4/7 weeks 2. A2 GDM on Glyburide 2.5 mg daily P:1. Start PItocin per protocol to augment labor 2. Dr Ouida Sills aware of pt status and agrees with the plan. 3. Continue to monitor UC/FHT and VS. 4. Continue Epidural Catheryn Bacon 04/29/2018, 8:08 PM

## 2018-04-29 NOTE — H&P (Signed)
OB History & Physical   History of Present Illness:  Chief Complaint: "Labor contractions since 1100am and now starting to hurt".  HPI:  Brenda Fox is a 27 y.o. [id number] female at [redacted]w[redacted]d dated by 6 4/7 week [place] with EDD of 05/16/18.    She presents to L&D for "painful contractions, no LOF, no VB, no decreased fM,   +FM, no CTX, no LOF, no VB. Pt is writhing in pain and hurting and asking for pain meds.   Pregnancy Issues: 1. Gest DM on Glyburide 2.5 mg daily at hs 2. Obesity 3. LEEP hx with no PTL S/S this pregnancy 4. Elevated AFP 5. Bilateral Fetal renal pelviectasis  6. Transporation issues 7. Lives with a friend  Maternal Medical History:   Past Medical History:  Diagnosis Date  . Cancer of cervix [facility])     Past Surgical History:  Procedure Laterality Date  . procedure for cervical cancer      No Known Allergies  Prior to Admission medications   Medication Sig Start Date End Date Taking? Authorizing Provider  glyBURIDE (DIABETA) 2.5 MG tablet Take 2.5 mg by mouth daily with breakfast.   Yes [provider]  Prenatal Vit-Fe Fumarate-FA (MULTIVITAMIN-PRENATAL) 27-0.8 MG TABS tablet Take 1 tablet by mouth daily at 12 noon.   Yes [provider]  promethazine (PHENERGAN) 25 MG tablet Take 1 tablet (25 mg total) by mouth every 6 (six) hours as needed for nausea or vomiting. Patient not taking: Reported on 03/18/2018 11/06/17   Harvest Dark, MD     Prenatal care site: [place] History: She  reports that she has never smoked. She has never used smokeless tobacco. She reports that she does not drink alcohol or use drugs.  Family History: family history is not on file.   Review of Systems: A full review of systems was performed and negative except as noted in the HPI.     Physical Exam:  Vital Signs: LMP 08/24/2017  General: no acute distress.  HEENT: normocephalic, atraumatic JYNWG:N5A2,  regular rate & rhythm.  No  murmurs/rubs/gallops Lungs: clear to auscultation bilaterally, normal respiratory effort Abdomen: soft, gravid, non-tender;  EFW:  Pelvic:   External: Normal external female genitalia  Cervix: Dilation: 6 / Effacement (%): 90 / Station: -2    Extremities: non-tender, symmetric, edema bilaterally.  DTRs:  Neurologic: Alert & oriented x 3.     Pertinent Results:  Prenatal Labs: Blood type/Rh  A pos  Antibody screen neg  Rubella Immune  Varicella Immune  RPR NR  HBsAg Neg  HIV NR  GC neg  Chlamydia neg  Genetic screening negative  1 hour GTT 255  3 hour GTT Not done as considered FM on above  GBS Neg   FHT:125, Cat 1, No decels TOCO:q 4-5 mins SVE:  Dilation: 6 / Effacement (%): 90 / Station: -2    Cephalic by leopolds  No results found.  Assessment:  Brenda Fox is a 27 y.o. [phone] female at [redacted]w[redacted]d with active labor pattern. Fetal wellbeing reassuring.   Plan:  1. Admit to Labor & Delivery 2. CBC, T&S, Clrs, IVF 3. GBS  Neg 4. Consents obtained. 5. Continuous efm/toco 6. Plans epidural.   ----- Danford Bad, MSN, CNM, [place] Certified Nurse Midwife [facility]

## 2018-04-29 NOTE — Progress Notes (Addendum)
Brenda Fox is a 27 y.o. [phone] at [redacted]w[redacted]d here for active labor  Subjective: Feel comfortable   Objective: BP (!) 107/49   Pulse 91   Temp 98.1 F (36.7 C) (Oral)   Resp 16   Ht 5\' 5"  (1.651 m)   Wt 90.3 kg   LMP 08/24/2017   SpO2 99%   BMI 33.12 kg/m  I/O last 3 completed shifts: In: 957 [I.V.:957] Out: -  No intake/output data recorded.  FHT: 145 UC:   q 4 mins SVE:   Dilation: 10 Effacement (%): 100 Station: -1 Exam by:: Ranell Patrick, CNM  Labs: Lab Results  Component Value Date   WBC 14.1 (H) 04/29/2018   HGB 12.4 04/29/2018   HCT 37.2 04/29/2018   MCV 81.3 04/29/2018   PLT 290 04/29/2018    Assessment / Plan: A:1. IUP at 37 4/7 weeks 2. Active labor with cx complete P: Antic SVD 2. Continue PItocin per protocol.  Dr Pauletta Browns is aware of pt status and agrees with the plan.   Brenda Fox 04/29/2018, 8:52 PM

## 2018-04-29 NOTE — Anesthesia Preprocedure Evaluation (Signed)
Anesthesia Evaluation  Patient identified by MRN, date of birth, ID band Patient awake    Reviewed: Allergy & Precautions, H&P , NPO status , Patient's Chart, lab work & pertinent test results  Airway Mallampati: III  TM Distance: >3 FB Neck ROM: full    Dental  (+) Teeth Intact   Pulmonary neg pulmonary ROS,           Cardiovascular negative cardio ROS       Neuro/Psych    GI/Hepatic GERD  Medicated,  Endo/Other  diabetes, Gestational, Oral Hypoglycemic Agents  Renal/GU      Musculoskeletal   Abdominal   Peds  Hematology negative hematology ROS (+)   Anesthesia Other Findings   Reproductive/Obstetrics (+) Pregnancy                             Anesthesia Physical Anesthesia Plan  ASA: II  Anesthesia Plan: Epidural   Post-op Pain Management:    Induction:   PONV Risk Score and Plan:   Airway Management Planned:   Additional Equipment:   Intra-op Plan:   Post-operative Plan:   Informed Consent: I have reviewed the patients History and Physical, chart, labs and discussed the procedure including the risks, benefits and alternatives for the proposed anesthesia with the patient or authorized representative who has indicated his/her understanding and acceptance.   Dental Advisory Given  Plan Discussed with: Anesthesiologist and CRNA  Anesthesia Plan Comments:         Anesthesia Quick Evaluation

## 2018-04-29 NOTE — OB Triage Note (Signed)
Contractions starting at 11am. Denies LOF, vaginal bleeding, or any other concerns. Pt states baby has been moving well.

## 2018-04-30 LAB — RPR: RPR Ser Ql: NONREACTIVE

## 2018-04-30 LAB — CBC
HCT: 31.8 % — ABNORMAL LOW (ref 35.0–47.0)
Hemoglobin: 10.7 g/dL — ABNORMAL LOW (ref 12.0–16.0)
MCH: 27.2 pg (ref 26.0–34.0)
MCHC: 33.6 g/dL (ref 32.0–36.0)
MCV: 80.7 fL (ref 80.0–100.0)
PLATELETS: 244 10*3/uL (ref 150–440)
RBC: 3.93 MIL/uL (ref 3.80–5.20)
RDW: 15.4 % — AB (ref 11.5–14.5)
WBC: 20.4 10*3/uL — AB (ref 3.6–11.0)

## 2018-04-30 MED ORDER — TETANUS-DIPHTH-ACELL PERTUSSIS 5-2.5-18.5 LF-MCG/0.5 IM SUSP: 0.5000 mL | Freq: Once | INTRAMUSCULAR | Status: DC

## 2018-04-30 MED ORDER — DIBUCAINE 1 % RE OINT: 1.0000 "application " | TOPICAL_OINTMENT | RECTAL | Status: DC | PRN

## 2018-04-30 MED ORDER — MEASLES, MUMPS & RUBELLA VAC ~~LOC~~ INJ
0.5000 mL | INJECTION | Freq: Once | SUBCUTANEOUS | Status: DC
  Filled 2018-04-30: qty 0.5

## 2018-04-30 MED ORDER — DIPHENHYDRAMINE HCL 25 MG PO CAPS: 25.0000 mg | ORAL_CAPSULE | Freq: Four times a day (QID) | ORAL | Status: DC | PRN

## 2018-04-30 MED ORDER — COCONUT OIL OIL
1.0000 "application " | TOPICAL_OIL | Status: DC | PRN
  Administered 2018-05-01: 1 via TOPICAL
  Filled 2018-04-30: qty 120

## 2018-04-30 MED ORDER — SIMETHICONE 80 MG PO CHEW: 80.0000 mg | CHEWABLE_TABLET | ORAL | Status: DC | PRN

## 2018-04-30 MED ORDER — ZOLPIDEM TARTRATE 5 MG PO TABS: 5.0000 mg | ORAL_TABLET | Freq: Every evening | ORAL | Status: DC | PRN

## 2018-04-30 MED ORDER — SODIUM CHLORIDE 0.9% FLUSH: 3.0000 mL | Freq: Two times a day (BID) | INTRAVENOUS | Status: DC

## 2018-04-30 MED ORDER — ACETAMINOPHEN 325 MG PO TABS
650.0000 mg | ORAL_TABLET | ORAL | Status: DC | PRN
  Administered 2018-04-30 – 2018-05-01 (×7): 650 mg via ORAL
  Filled 2018-04-30 (×8): qty 2

## 2018-04-30 MED ORDER — PRENATAL MULTIVITAMIN CH
1.0000 | ORAL_TABLET | Freq: Every day | ORAL | Status: DC
  Administered 2018-04-30 – 2018-05-01 (×2): 1 via ORAL
  Filled 2018-04-30 (×2): qty 1

## 2018-04-30 MED ORDER — SODIUM CHLORIDE 0.9 % IV SOLN: 250.0000 mL | INTRAVENOUS | Status: DC | PRN

## 2018-04-30 MED ORDER — BENZOCAINE-MENTHOL 20-0.5 % EX AERO: 1.0000 "application " | INHALATION_SPRAY | CUTANEOUS | Status: DC | PRN

## 2018-04-30 MED ORDER — BISACODYL 10 MG RE SUPP: 10.0000 mg | Freq: Every day | RECTAL | Status: DC | PRN

## 2018-04-30 MED ORDER — SODIUM CHLORIDE 0.9% FLUSH: 3.0000 mL | INTRAVENOUS | Status: DC | PRN

## 2018-04-30 MED ORDER — IBUPROFEN 600 MG PO TABS
600.0000 mg | ORAL_TABLET | Freq: Four times a day (QID) | ORAL | Status: DC
  Administered 2018-04-30 – 2018-05-01 (×6): 600 mg via ORAL
  Filled 2018-04-30 (×6): qty 1

## 2018-04-30 MED ORDER — FLEET ENEMA 7-19 GM/118ML RE ENEM: 1.0000 | ENEMA | Freq: Every day | RECTAL | Status: DC | PRN

## 2018-04-30 MED ORDER — IBUPROFEN 600 MG PO TABS
600.0000 mg | ORAL_TABLET | Freq: Four times a day (QID) | ORAL | Status: DC
  Administered 2018-04-30: 600 mg via ORAL
  Filled 2018-04-30: qty 1

## 2018-04-30 MED ORDER — OXYCODONE HCL 5 MG PO TABS: 5.0000 mg | ORAL_TABLET | ORAL | Status: DC | PRN

## 2018-04-30 MED ORDER — WITCH HAZEL-GLYCERIN EX PADS: 1.0000 "application " | MEDICATED_PAD | CUTANEOUS | Status: DC | PRN

## 2018-04-30 MED ORDER — SENNOSIDES-DOCUSATE SODIUM 8.6-50 MG PO TABS
2.0000 | ORAL_TABLET | ORAL | Status: DC
  Administered 2018-04-30 – 2018-05-01 (×2): 2 via ORAL
  Filled 2018-04-30 (×2): qty 2

## 2018-04-30 MED ORDER — ONDANSETRON HCL 4 MG/2ML IJ SOLN: 4.0000 mg | INTRAMUSCULAR | Status: DC | PRN

## 2018-04-30 MED ORDER — ONDANSETRON HCL 4 MG PO TABS: 4.0000 mg | ORAL_TABLET | ORAL | Status: DC | PRN

## 2018-04-30 NOTE — Progress Notes (Signed)
Post Partum Day 1 Subjective: no complaints, up ad lib and voiding  Objective: Blood pressure (!) 99/55, pulse 83, temperature 99 F (37.2 C), temperature source Oral, resp. rate 18, height 5\' 5"  (1.651 m), weight 90.3 kg, last menstrual period 08/24/2017, SpO2 99 %, unknown if currently breastfeeding.  Physical Exam:  General: alert and cooperative Lochia: appropriate Uterine Fundus: firm DVT Evaluation: No evidence of DVT seen on physical exam. Negative Homan's sign.  Recent Labs    04/29/18 1517 04/30/18 0544  HGB 12.4 10.7*  HCT 37.2 31.8*    Assessment/Plan: Plan for discharge tomorrow  Acute blood loss anemia: PO iron   LOS: 1 day   Benjaman Kindler 04/30/2018, 9:05 AM

## 2018-04-30 NOTE — Plan of Care (Signed)
Reviewed plan of care with patient. All questions answered, will continue to monitor.

## 2018-04-30 NOTE — Anesthesia Postprocedure Evaluation (Signed)
Anesthesia Post Note  Patient: Brenda Fox  Procedure(s) Performed: AN AD HOC LABOR EPIDURAL  Patient location during evaluation: Mother Baby Anesthesia Type: Epidural Level of consciousness: awake, awake and alert and oriented Pain management: pain level controlled Vital Signs Assessment: post-procedure vital signs reviewed and stable Respiratory status: spontaneous breathing Cardiovascular status: blood pressure returned to baseline Postop Assessment: no headache, no backache and able to ambulate Anesthetic complications: no     Last Vitals:  Vitals:   04/30/18 0305 04/30/18 0756  BP: 126/66 (!) 99/55  Pulse: 99 83  Resp: 18 18  Temp: 36.9 C 37.2 C  SpO2: 98% 99%    Last Pain:  Vitals:   04/30/18 0818  TempSrc:   PainSc: [place]

## 2018-04-30 NOTE — Anesthesia Procedure Notes (Signed)
Epidural Patient location during procedure: OB Start time: 04/29/2018 4:22 PM End time: 04/29/2018 4:30 PM  Staffing Anesthesiologist: Alvin Critchley, MD Resident/CRNA: Hedda Slade, CRNA Performed: resident/CRNA and anesthesiologist   Preanesthetic Checklist Completed: patient identified, site marked, surgical consent, pre-op evaluation, timeout performed, IV checked, risks and benefits discussed and monitors and equipment checked  Epidural Patient position: sitting Prep: ChloraPrep Patient monitoring: heart rate, continuous pulse ox and blood pressure Approach: midline Location: L3-L4 Injection technique: LOR saline  Needle:  Needle type: Tuohy  Needle gauge: 17 G Needle length: 9 cm and 9 Needle insertion depth: 8 cm Catheter type: closed end flexible Catheter size: 19 Gauge Catheter at skin depth: 13 cm Test dose: negative and 1.5% lidocaine with Epi 1:200 K  Assessment Events: blood not aspirated, injection not painful, no injection resistance, negative IV test and no paresthesia  Additional Notes 1 attempt Pt. Evaluated and documentation done after procedure finished. Patient identified. Risks/Benefits/Options discussed with patient including but not limited to bleeding, infection, nerve damage, paralysis, failed block, incomplete pain control, headache, blood pressure changes, nausea, vomiting, reactions to medication both or allergic, itching and postpartum back pain. Confirmed with bedside nurse the patient's most recent platelet count. Confirmed with patient that they are not currently taking any anticoagulation, have any bleeding history or any family history of bleeding disorders. Patient expressed understanding and wished to proceed. All questions were answered. Sterile technique was used throughout the entire procedure. Please see nursing notes for vital signs. Test dose was given through epidural catheter and negative prior to continuing to dose epidural or start  infusion. Warning signs of high block given to the patient including shortness of breath, tingling/numbness in hands, complete motor block, or any concerning symptoms with instructions to call for help. Patient was given instructions on fall risk and not to get out of bed. All questions and concerns addressed with instructions to call with any issues or inadequate analgesia.   Patient tolerated the insertion well without immediate complications.Reason for block:procedure for pain

## 2018-05-01 LAB — HIV ANTIBODY (ROUTINE TESTING W REFLEX): HIV SCREEN 4TH GENERATION: NONREACTIVE

## 2018-05-01 MED ORDER — ACETAMINOPHEN 325 MG PO TABS: 650.0000 mg | ORAL_TABLET | ORAL | Status: DC | PRN

## 2018-05-01 MED ORDER — SENNOSIDES-DOCUSATE SODIUM 8.6-50 MG PO TABS: 2.0000 | ORAL_TABLET | ORAL | Status: DC

## 2018-05-01 MED ORDER — IBUPROFEN 600 MG PO TABS: 600.0000 mg | ORAL_TABLET | Freq: Four times a day (QID) | ORAL | 0 refills | Status: DC

## 2018-05-01 NOTE — Plan of Care (Signed)
Vs stable; up ad lib; taking motrin and tylenol for pain control; tolerating regular diet; breast feeding; pt encouraged to breast feed longer when baby at the breast; pt also using formula and gave baby pacifier; probable discharge today

## 2018-05-01 NOTE — Progress Notes (Signed)
While Film/video editor and nursery nurse were in the room, parents of the infant started arguing in [place] about the birth certificate. Mom seemed very tentative about signing the birth certificate, as well as letting dad sign it. While they were arguing the birth certificate clerk and nursery nurse said they would step up for a minute to let them discuss the matter in private. They reported to the RN that something odd was going on in that room and it made them feel uncomfortable and that the RN needed to go in and speak with the patient to make sure everything was okay.   I went in with the birth certificate clerk shortly after, after asking the FOB and the daughter of the patient to step out and asked the patient if she felt safe at home, because it was my duty to make sure that she felt safe. She said she does feel safe. I also asked her if she felt safe around him and she said yes. I asked her if she lived with him and she said no, she lives with her daughter, that her and the FOB do not live together. I also I asked her if he would be taking her home and she said no, it would be her sister. The Birth Certificate Brenda Fox asked her if she had any questions about the birth certificate cause she seemed tentative to sign it, she paused for a minute (I noticed her shaking) and replied no, only thing is will the social security card be sent to [place]? The clerk said yes, it will be sent to the address listed for your residence. She said ok, that is all.

## 2018-05-01 NOTE — Progress Notes (Signed)
Post Partum Day 2 Subjective: Doing well, no complaints.  Tolerating regular diet, pain with PO meds, voiding and ambulating without difficulty.  No CP SOB Fever,Chills, N/V or leg pain; denies nipple or breast pain, no HA change of vision, RUQ/epigastric pain  Objective: BP 103/69 (BP Location: Right Arm)   Pulse 81   Temp 97.6 F (36.4 C) (Oral)   Resp 18   Ht 5\' 5"  (1.651 m)   Wt 90.3 kg   LMP 08/24/2017   SpO2 100%   Breastfeeding? Unknown   BMI 33.12 kg/m    Physical Exam:  General: NAD Breasts: soft/nontender CV: RRR Pulm: nl effort, CTABL Abdomen: soft, NT, BS x 4 Perineum: minimal edema, intact Lochia: small Uterine Fundus: fundus firm and 2 fb below umbilicus DVT Evaluation: no cords, ttp LEs   Recent Labs    04/29/18 1517 04/30/18 0544  HGB 12.4 10.7*  HCT 37.2 31.8*  WBC 14.1* 20.4*  PLT 290 244    Assessment/Plan: 27 y.o. [id number] postpartum day # 2  - Continue routine PP care - Lactation consult prn.  - Discussed contraceptive options including implant, IUDs hormonal and non-hormonal, injection, pills/ring/patch, condoms, and NFP. Desires Nexplanon.  - Mild anemia - hemodynamically stable and asymptomatic; continue daily PNV - Immunization status: all Imms up to date    Disposition: Does desire Dc home today.     Sufian Ravi, [place], CNM 05/01/2018  10:25 AM

## 2018-05-01 NOTE — Lactation Note (Signed)
This note was copied from a baby's chart. Lactation Consultation Note  Patient Name: Brenda Fox [id number]'S Date: 05/01/2018     Maternal Data    Feeding Feeding Type: Breast Fed Length of feed: 35 min  LATCH Score                   Interventions    Lactation Tools Discussed/Used     Consult Status  LC to room to assess the progress of breastfeeding. Mother states that her nipples are sore and she feels that infant is not getting a good latch. She said that infant last breastfed around 8:45 for 35 minutes on the left breast and now her nipple is sore and burning on that side. LC provided coconut oil and instructed mother on the use. Mother states that she would like to have formula to have just in case so her nipples can heal. LC asked mother if she would like to initiate pumping and mother declined. LC encouraged mother to call lactation at the next feed so we can troubleshoot infant's latch and mother agreed.      [name] 4/96/7591, 10:37 AM

## 2018-05-01 NOTE — Progress Notes (Signed)
Patient discharged home with infant. Discharge instructions, prescriptions and follow up appointment given to and reviewed with patient. Patient verbalized understanding. Patient wheeled out with infant by auxiliary.  

## 2019-06-21 ENCOUNTER — Other Ambulatory Visit: Payer: Self-pay

## 2019-06-21 ENCOUNTER — Emergency Department (HOSPITAL_COMMUNITY)
Admission: EM | Admit: 2019-06-21 | Discharge: 2019-06-21 | Disposition: A | Discharge status: Home / Self Care | Payer: Medicaid Other | Attending: Emergency Medicine | Admitting: Emergency Medicine

## 2019-06-21 ENCOUNTER — Emergency Department (HOSPITAL_COMMUNITY): Payer: Medicaid Other

## 2019-06-21 DIAGNOSIS — R1031 Right lower quadrant pain: Secondary | ICD-10-CM | POA: Diagnosis not present

## 2019-06-21 DIAGNOSIS — N760 Acute vaginitis: Secondary | ICD-10-CM | POA: Insufficient documentation

## 2019-06-21 DIAGNOSIS — B9689 Other specified bacterial agents as the cause of diseases classified elsewhere: Secondary | ICD-10-CM | POA: Diagnosis not present

## 2019-06-21 DIAGNOSIS — B009 Herpesviral infection, unspecified: Secondary | ICD-10-CM | POA: Diagnosis not present

## 2019-06-21 DIAGNOSIS — Z79899 Other long term (current) drug therapy: Secondary | ICD-10-CM | POA: Insufficient documentation

## 2019-06-21 DIAGNOSIS — R21 Rash and other nonspecific skin eruption: Secondary | ICD-10-CM | POA: Diagnosis present

## 2019-06-21 LAB — URINALYSIS, ROUTINE W REFLEX MICROSCOPIC
Bilirubin Urine: NEGATIVE
Glucose, UA: NEGATIVE mg/dL
Hgb urine dipstick: NEGATIVE
Ketones, ur: 5 mg/dL — AB
Leukocytes,Ua: NEGATIVE
Nitrite: POSITIVE — AB
Protein, ur: 30 mg/dL — AB
Specific Gravity, Urine: 1.026 (ref 1.005–1.030)
pH: 6 (ref 5.0–8.0)

## 2019-06-21 LAB — WET PREP, GENITAL
Sperm: NONE SEEN
Trich, Wet Prep: NONE SEEN
Yeast Wet Prep HPF POC: NONE SEEN

## 2019-06-21 LAB — CBC WITH DIFFERENTIAL/PLATELET
Abs Immature Granulocytes: 0.04 10*3/uL (ref 0.00–0.07)
Basophils Absolute: 0.1 10*3/uL (ref 0.0–0.1)
Basophils Relative: 0 %
Eosinophils Absolute: 0 10*3/uL (ref 0.0–0.5)
Eosinophils Relative: 0 %
HCT: 47.4 % — ABNORMAL HIGH (ref 36.0–46.0)
Hemoglobin: 14.5 g/dL (ref 12.0–15.0)
Immature Granulocytes: 0 %
Lymphocytes Relative: 18 %
Lymphs Abs: 2 10*3/uL (ref 0.7–4.0)
MCH: 26.7 pg (ref 26.0–34.0)
MCHC: 30.6 g/dL (ref 30.0–36.0)
MCV: 87.1 fL (ref 80.0–100.0)
Monocytes Absolute: 0.9 10*3/uL (ref 0.1–1.0)
Monocytes Relative: 8 %
Neutro Abs: 8.6 10*3/uL — ABNORMAL HIGH (ref 1.7–7.7)
Neutrophils Relative %: 74 %
Platelets: 379 10*3/uL (ref 150–400)
RBC: 5.44 MIL/uL — ABNORMAL HIGH (ref 3.87–5.11)
RDW: 13.9 % (ref 11.5–15.5)
WBC: 11.6 10*3/uL — ABNORMAL HIGH (ref 4.0–10.5)
nRBC: 0 % (ref 0.0–0.2)

## 2019-06-21 LAB — COMPREHENSIVE METABOLIC PANEL
ALT: 30 U/L (ref 0–44)
AST: 20 U/L (ref 15–41)
Albumin: 4.4 g/dL (ref 3.5–5.0)
Alkaline Phosphatase: 64 U/L (ref 38–126)
Anion gap: 10 (ref 5–15)
BUN: 11 mg/dL (ref 6–20)
CO2: 24 mmol/L (ref 22–32)
Calcium: 9.2 mg/dL (ref 8.9–10.3)
Chloride: 104 mmol/L (ref 98–111)
Creatinine, Ser: 0.74 mg/dL (ref 0.44–1.00)
GFR calc Af Amer: >60 mL/min (ref >60)
GFR calc non Af Amer: >60 mL/min (ref >60)
Glucose, Bld: 95 mg/dL (ref 70–99)
Potassium: 3.8 mmol/L (ref 3.5–5.1)
Sodium: 138 mmol/L (ref 135–145)
Total Bilirubin: 0.4 mg/dL (ref 0.3–1.2)
Total Protein: 8.3 g/dL — ABNORMAL HIGH (ref 6.5–8.1)

## 2019-06-21 LAB — I-STAT BETA HCG BLOOD, ED (MC, WL, AP ONLY): I-stat hCG, quantitative: <5 m[IU]/mL (ref <5)

## 2019-06-21 MED ORDER — METRONIDAZOLE 500 MG PO TABS: 500.0000 mg | ORAL_TABLET | Freq: Two times a day (BID) | ORAL | 0 refills | Status: AC

## 2019-06-21 MED ORDER — AZITHROMYCIN 250 MG PO TABS
1000.0000 mg | ORAL_TABLET | Freq: Once | ORAL | Status: AC
  Administered 2019-06-21: 23:00:00 1000 mg via ORAL
  Filled 2019-06-21: qty 4

## 2019-06-21 MED ORDER — CEFTRIAXONE SODIUM 250 MG IJ SOLR
250.0000 mg | Freq: Once | INTRAMUSCULAR | Status: AC
  Administered 2019-06-21: 250 mg via INTRAMUSCULAR
  Filled 2019-06-21: qty 250

## 2019-06-21 MED ORDER — KETOROLAC TROMETHAMINE 30 MG/ML IJ SOLN
30.0000 mg | Freq: Once | INTRAMUSCULAR | Status: AC
  Administered 2019-06-21: 30 mg via INTRAMUSCULAR
  Filled 2019-06-21: qty 1

## 2019-06-21 MED ORDER — ACYCLOVIR 400 MG PO TABS: 400.0000 mg | ORAL_TABLET | Freq: Three times a day (TID) | ORAL | 0 refills | Status: AC

## 2019-06-21 MED ORDER — KETOROLAC TROMETHAMINE 30 MG/ML IJ SOLN: 30.0000 mg | Freq: Once | INTRAMUSCULAR | 0 refills | Status: DC

## 2019-06-21 NOTE — Discharge Instructions (Addendum)
Today you were treated for gonorrhea and chlamydia, please refrain from sexual intercourse for the 10 days.     I have prescribed antiviral to help clear the rash, please take 1 tablet three times a day for the next 10 days.  I have also prescribed antibiotics to help treat your bacterial vaginosis.

## 2019-06-21 NOTE — ED Triage Notes (Signed)
Pt states she has pain with urinationg.  She also has "bumps" around anus and on labia. Admits to unprotected sex.

## 2019-06-21 NOTE — ED Provider Notes (Signed)
[facility] DEPT Provider Note   CSN: [id number] Arrival date & time: 06/21/19  1244     History   Chief Complaint Chief Complaint  Patient presents with   Urinary Tract Infection   SEXUALLY TRANSMITTED DISEASE    HPI Brenda Fox is a 28 y.o. female.     28 y.o female with a PMH of Chlamydia presents to the ED with a chief complaint of rash x 3 days. Patient reports feeling "a bump" to her anus three days ago reports this has now turned to several bumps in her anus and now her vagina. She reports feeling pain when urine hits these bumps along with when she wipe. She reports her pain worsen today, as she now feels "like I am peeing glass", states the dysuria is very severe. She also endorses pain with defecation and wiping. Denies any blood in her stool or prior history of hemorrhoids. Patient has tried tylenol, cranberry juice but reports no improvement of symptoms. She is currently sexually active with one partner. She denies any fever, prior history of STI, no abdominal pain or back pain.   The history is provided by the patient.    Past Medical History:  Diagnosis Date   Cancer of cervix [facility])     Patient Active Problem List   Diagnosis Date Noted   Indication for care in labor and delivery, antepartum 04/29/2018   Preterm uterine contractions in second trimester, antepartum 02/06/2018   Pregnancy 12/29/2017   Hyperemesis arising during pregnancy 10/21/2017    Past Surgical History:  Procedure Laterality Date   procedure for cervical cancer       OB History    Gravida  4   Para  2   Term  2   Preterm      AB  2   Living  2     SAB      TAB      Ectopic      Multiple  0   Live Births  1            Home Medications    Prior to Admission medications   Medication Sig Start Date End Date Taking? Authorizing Provider  acetaminophen (TYLENOL) 325 MG tablet Take 2 tablets (650 mg total) by mouth every 4  (four) hours as needed (for pain scale < 4). Patient not taking: Reported on 06/21/2019 05/01/18   McVey, Murray Hodgkins, CNM  acyclovir (ZOVIRAX) 400 MG tablet Take 1 tablet (400 mg total) by mouth 3 (three) times daily for 10 days. 06/21/19 07/01/19  Janeece Fitting, PA-C  ibuprofen (ADVIL,MOTRIN) 600 MG tablet Take 1 tablet (600 mg total) by mouth every 6 (six) hours. Patient not taking: Reported on 06/21/2019 05/01/18   McVey, Murray Hodgkins, CNM  metroNIDAZOLE (FLAGYL) 500 MG tablet Take 1 tablet (500 mg total) by mouth 2 (two) times daily for 7 days. 06/21/19 06/28/19  Janeece Fitting, PA-C  senna-docusate (SENOKOT-S) 8.6-50 MG tablet Take 2 tablets by mouth daily. Patient not taking: Reported on 06/21/2019 05/02/18   McVey, Murray Hodgkins, CNM    Family History No family history on file.  Social History Social History   Tobacco Use   Smoking status: Never Smoker   Smokeless tobacco: Never Used  Substance Use Topics   Alcohol use: No   Drug use: No     Allergies   Patient has no known allergies.   Review of Systems Review of Systems  Constitutional: Negative for fever.  HENT: Negative for sinus pain.   Respiratory: Negative for shortness of breath.   Cardiovascular: Negative for chest pain.  Gastrointestinal: Negative for abdominal pain, diarrhea, nausea and vomiting.  Genitourinary: Positive for dysuria. Negative for flank pain.  Musculoskeletal: Negative for back pain and myalgias.  Skin: Positive for rash.     Physical Exam Updated Vital Signs BP 120/68    Pulse 92    Temp 99.1 F (37.3 C) (Oral)    Resp 18    SpO2 100%   Physical Exam Vitals signs and nursing note reviewed. Exam conducted with a chaperone present.  Constitutional:      General: She is not in acute distress.    Appearance: She is well-developed.  HENT:     Head: Normocephalic and atraumatic.     Mouth/Throat:     Pharynx: No oropharyngeal exudate.  Eyes:     Pupils: Pupils are equal, round, and reactive to  light.  Neck:     Musculoskeletal: Normal range of motion.  Cardiovascular:     Rate and Rhythm: Regular rhythm.     Heart sounds: Normal heart sounds.  Pulmonary:     Effort: Pulmonary effort is normal. No respiratory distress.     Breath sounds: Normal breath sounds.  Abdominal:     General: Bowel sounds are normal. There is no distension.     Palpations: Abdomen is soft.     Tenderness: There is no abdominal tenderness.  Genitourinary:    Exam position: Supine.     Labia:        Right: Rash and tenderness present.        Left: Rash and tenderness present.      Vagina: Vaginal discharge and lesions present.     Adnexa: Right adnexa normal and left adnexa normal.       Comments: Chaperoned by NT Pinpoint white vesicles with surrounding halos throughout surrounding anus & vaginal region. No external hemorrhoids on my exam. Musculoskeletal:        General: No tenderness or deformity.     Right lower leg: No edema.     Left lower leg: No edema.  Skin:    General: Skin is warm and dry.  Neurological:     Mental Status: She is alert and oriented to person, place, and time.      ED Treatments / Results  Labs (all labs ordered are listed, but only abnormal results are displayed) Labs Reviewed  WET PREP, GENITAL - Abnormal; Notable for the following components:      Result Value   Clue Cells Wet Prep HPF POC PRESENT (*)    WBC, Wet Prep HPF POC MODERATE (*)    All other components within normal limits  URINALYSIS, ROUTINE W REFLEX MICROSCOPIC - Abnormal; Notable for the following components:   Color, Urine AMBER (*)    APPearance HAZY (*)    Ketones, ur 5 (*)    Protein, ur 30 (*)    Nitrite POSITIVE (*)    Bacteria, UA RARE (*)    All other components within normal limits  CBC WITH DIFFERENTIAL/PLATELET - Abnormal; Notable for the following components:   WBC 11.6 (*)    RBC 5.44 (*)    HCT 47.4 (*)    Neutro Abs 8.6 (*)    All other components within normal limits    COMPREHENSIVE METABOLIC PANEL - Abnormal; Notable for the following components:   Total Protein 8.3 (*)    All other components within  normal limits  URINE CULTURE  HSV CULTURE AND TYPING  I-STAT BETA HCG BLOOD, ED ([facility], [facility], [facility] ONLY)  GC/CHLAMYDIA PROBE AMP ([place]) NOT AT [facility]    EKG None  Radiology Ct Renal Stone Study  Result Date: 06/21/2019 CLINICAL DATA:  Flank pain, pain with urination EXAM: CT ABDOMEN AND PELVIS WITHOUT CONTRAST TECHNIQUE: Multidetector CT imaging of the abdomen and pelvis was performed following the standard protocol without IV contrast. COMPARISON:  None. FINDINGS: Lower chest: The visualized heart size within normal limits. No pericardial fluid/thickening. No hiatal hernia. The visualized portions of the lungs are clear. Hepatobiliary: Although limited due to the lack of intravenous contrast, normal in appearance without gross focal abnormality. No evidence of calcified gallstones or biliary ductal dilatation. Pancreas:  Unremarkable.  No surrounding inflammatory changes. Spleen: Normal in size. Although limited due to the lack of intravenous contrast, normal in appearance. Adrenals/Urinary Tract: Both adrenal glands appear normal. The kidneys and collecting system appear normal without evidence of urinary tract calculus or hydronephrosis. Bladder is unremarkable. Stomach/Bowel: The stomach, small bowel, and colon are normal in appearance. There is a moderate amount of colonic stool. No inflammatory changes or obstructive findings. appendix is normal. Vascular/Lymphatic: There are no enlarged abdominal or pelvic lymph nodes. No significant gross vascular findings are present. Reproductive: The uterus and adnexa are unremarkable. Other: No evidence of abdominal wall mass or hernia. Musculoskeletal: No acute or significant osseous findings. IMPRESSION: No renal or collecting system calculi. No other acute intra-abdominal or pelvic pathology. Electronically Signed   By:  Prudencio Pair M.D.   On: 06/21/2019 21:35    Procedures Procedures (including critical care time)  Medications Ordered in ED Medications  cefTRIAXone (ROCEPHIN) injection 250 mg (has no administration in time range)  azithromycin (ZITHROMAX) tablet 1,000 mg (has no administration in time range)  ketorolac (TORADOL) 30 MG/ML injection 30 mg (30 mg Intramuscular Given 06/21/19 2131)     Initial Impression / Assessment and Plan / ED Course  I have reviewed the triage vital signs and the nursing notes.  Pertinent labs & imaging results that were available during my care of the patient were reviewed by me and considered in my medical decision making (see chart for details).  Clinical Course as of Jun 20 2225  Sat Jun 21, 2019  1941 Nitrite(!): POSITIVE [JS]  2211 WBC, Wet Prep HPF POC(!): MODERATE [JS]  2211 Clue Cells Wet Prep HPF POC(!): PRESENT [JS]    Clinical Course User Index [JS] Janeece Fitting, PA-C      Patient with a past medical history of chlamydia presents the ED with complaints of rash, reports a rash mainly appearing around her anal region, has now spread onto her vaginal region.  She reports pain with urination, defecation S fluids become in contact with her rash.  She has not tried any medication for this aside from Tylenol, cranberry juice.  She also reports feeling a sharp sensation with urination which she reports feels like glass.  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  She denies any prior history of this.  Is currently sexually active with one partner, had no concerns for any STIs.  She did not also endorse some abdominal pain, reports somewhat right flank pain.  UA was remarkable for positive nitrites, and was sent for culture.  CBC with a slight cytosis at 11.6, CMP without any electrolyte abnormality,  creatinine level is unremarkable.   LFTs are within normal limits.  Patient was provided with Toradol to help with her pain as she reports it is very painful to urinate.  Prep reveals some present clue cells, moderate white blood cell count.  A GC and chlamydia was sent off for culture.  I have also attempted to obtain an HSV culture from her pelvic exam. A CT renal was obtained to rule out any infected stone.  This showed:\ No renal or collecting system calculi. No other acute  intra-abdominal or pelvic pathology.     Patient reports her pain has improved after the Toradol.  She will be sent home with a prescription for Flagyl to treat her bacterial vaginosis, her urine was sent for culture with nitrite positive.  She will also be treated with acyclovir as this is her first episode of HSV-2, she has been educated about treatment and management of this disease.   Portions of this note were generated with [employer]. Dictation errors may occur despite best attempts at proofreading.  Final Clinical Impressions(s) / ED Diagnoses   Final diagnoses:  HSV-2 (herpes simplex virus 2) infection  BV (bacterial vaginosis)    ED Discharge Orders         Ordered    ketorolac (TORADOL) 30 MG/ML injection   Once,   Status:  Discontinued     06/21/19 2112    acyclovir (ZOVIRAX) 400 MG tablet  3 times daily     06/21/19 2211    metroNIDAZOLE (FLAGYL) 500 MG tablet  2 times daily     06/21/19 2212           Janeece Fitting, PA-C 06/21/19 2226    Blanchie Dessert, MD 06/21/19 2303

## 2019-06-24 LAB — URINE CULTURE: Culture: >=100000 — AB

## 2019-06-24 LAB — HSV CULTURE AND TYPING

## 2019-06-24 LAB — GC/CHLAMYDIA PROBE AMP (~~LOC~~) NOT AT ARMC
Chlamydia: POSITIVE — AB
Neisseria Gonorrhea: NEGATIVE

## 2019-06-25 ENCOUNTER — Telehealth: Payer: Self-pay | Admitting: *Deleted

## 2019-06-25 NOTE — Progress Notes (Signed)
ED Antimicrobial Stewardship Positive Culture Follow Up   Brenda Fox is an 28 y.o. female who presented to [facility] on 06/21/2019 with a chief complaint of  Chief Complaint  Patient presents with  . Urinary Tract Infection  . SEXUALLY TRANSMITTED DISEASE    Recent Results (from the past 720 hour(s))  Urine culture     Status: Abnormal   Collection Time: 06/21/19  7:20 PM   Specimen: Urine, Clean Catch  Result Value Ref Range Status   Specimen Description   Final    URINE, CLEAN CATCH Performed at [facility], [place] [address] [place], [place] [address]    Special Requests   Final    NONE Performed at [facility], [place] [address] [place], [place] [address]    Culture >=100,000 COLONIES/mL ESCHERICHIA COLI (A)  Final   Report Status 06/24/2019 FINAL  Final   Organism ID, Bacteria ESCHERICHIA COLI (A)  Final      Susceptibility   Escherichia coli - MIC*    AMPICILLIN >=32 RESISTANT Resistant     CEFAZOLIN <=4 SENSITIVE Sensitive     CEFTRIAXONE <=1 SENSITIVE Sensitive     CIPROFLOXACIN <=0.25 SENSITIVE Sensitive     GENTAMICIN <=1 SENSITIVE Sensitive     IMIPENEM <=0.25 SENSITIVE Sensitive     NITROFURANTOIN <=16 SENSITIVE Sensitive     TRIMETH/SULFA <=20 SENSITIVE Sensitive     AMPICILLIN/SULBACTAM 16 INTERMEDIATE Intermediate     PIP/TAZO <=4 SENSITIVE Sensitive     Extended ESBL NEGATIVE Sensitive     * >=100,000 COLONIES/mL ESCHERICHIA COLI  Wet prep, genital     Status: Abnormal   Collection Time: 06/21/19  8:06 PM   Specimen: Cervix  Result Value Ref Range Status   Yeast Wet Prep HPF POC NONE SEEN NONE SEEN Final   Trich, Wet Prep NONE SEEN NONE SEEN Final   Clue Cells Wet Prep HPF POC PRESENT (A) NONE SEEN Final   WBC, Wet Prep HPF POC MODERATE (A) NONE SEEN Final   Sperm NONE SEEN  Final    Comment: Performed at [facility], [place] [address] [place],  [address]  Hsv Culture And Typing     Status:  None   Collection Time: 06/21/19  8:06 PM   Specimen: Vaginal; Other  Result Value Ref Range Status   HSV Culture/Type REJ5  Final    Comment: (NOTE) The specimen submitted does not meet the laboratory's criteria for acceptability. Refer to [employer] of Services for specimen acceptability criteria.      Rejected because received Aptima Swab      Joette Catching notified 06/24/2019 Performed At: [facility] [place], [place] [id number] Rush Farmer MD [id number]    Source of Sample VAGINAL/RECTAL  Final    Comment: Performed at [facility], [place] [address] [place],  [address]    [x]  Treated with metronidazole (for BV), organism not covered by this medication []  Patient discharged originally without antimicrobial agent and treatment is now indicated  Plan: 1) Call pt to f/u on dysuria      - If dysuria has improved, no treatment needed      - If dysuria has not changed or has worsened: recommend keflex 500mg  BID x5 days  ED Provider: Dr. Clayborn Bigness 06/25/2019, 10:32 AM Clinical Pharmacist [phone]

## 2019-06-25 NOTE — Telephone Encounter (Signed)
Post ED Visit - Positive Culture Follow-up: Unsuccessful Patient Follow-up  Culture assessed and recommendations reviewed by:  []  Elenor Quinones, Pharm.D. []  Heide Guile, Pharm.D., BCPS AQ-ID []  Parks Neptune, Pharm.D., BCPS []  Alycia Rossetti, Pharm.D., BCPS []  [place], Pharm.D., BCPS, AAHIVP []  Legrand [place], Pharm.D., BCPS, AAHIVP []  Wynell Balloon, PharmD []  Vincenza Hews, PharmD, BCPS  Positive urine culture  []  Patient discharged without antimicrobial prescription and treatment is now indicated [x]  Organism is resistant to prescribed ED discharge antimicrobial []  Patient with positive blood cultures  Plan:  Needs symptom check. If remains symptomatic change antibiotic to Keflex 500mg  PO BID x 5 days  Unable to contact patient after 3 attempts, letter will be sent to address on file  Ardeen Fillers 06/25/2019, 10:11 AM

## 2019-10-29 IMAGING — US US OB TRANSVAGINAL
1 series · 14 of 28 positions shown · non-contrast
Comparison: None.

CLINICAL DATA: Pain, early pregnancy.

EXAM:
OBSTETRIC <14 WK US AND TRANSVAGINAL OB US
TECHNIQUE: Both transabdominal and transvaginal ultrasound examinations were
performed for complete evaluation of the gestation as well as the
maternal uterus, adnexal regions, and pelvic cul-de-sac.
Transvaginal technique was performed to assess early pregnancy.

[Series 1: us ob transvaginal · 0.23mm/px · 14 of 96 slices shown]
[im 4/96]
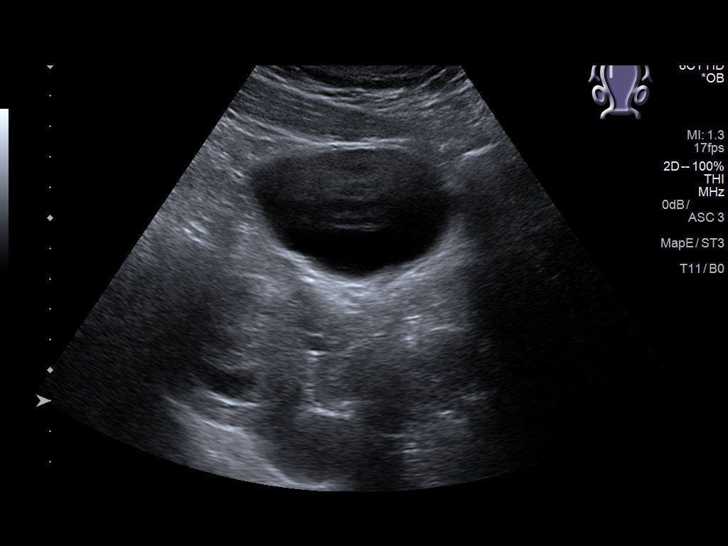
[im 11/96]
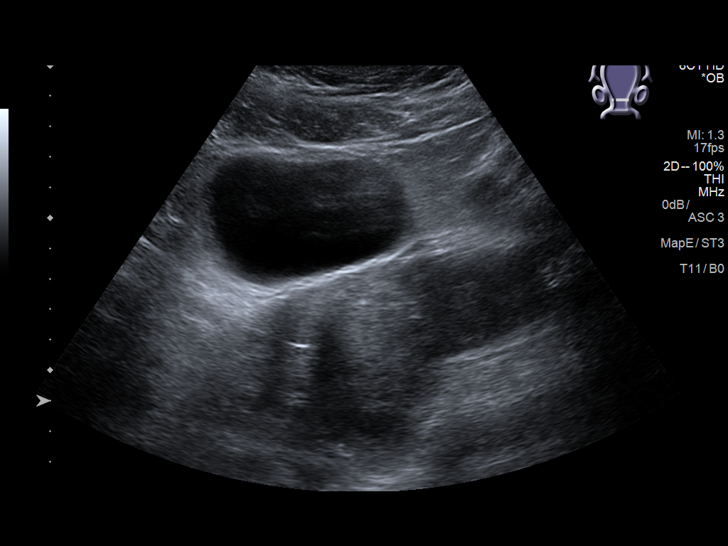
[im 18/96]
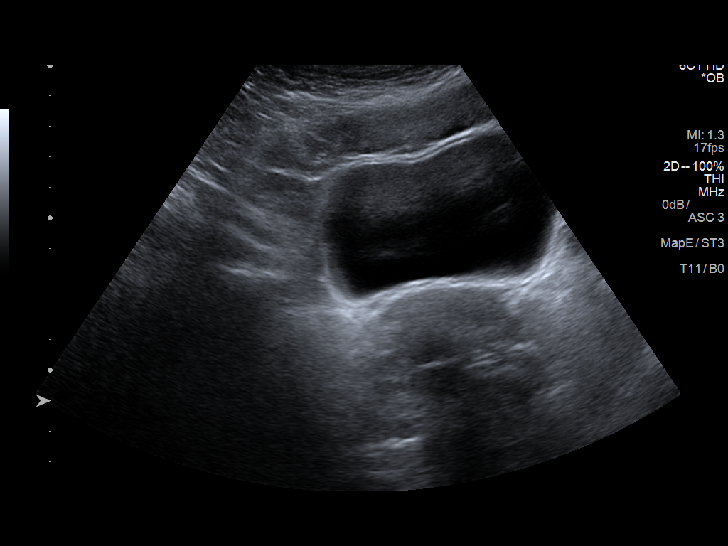
[im 25/96]
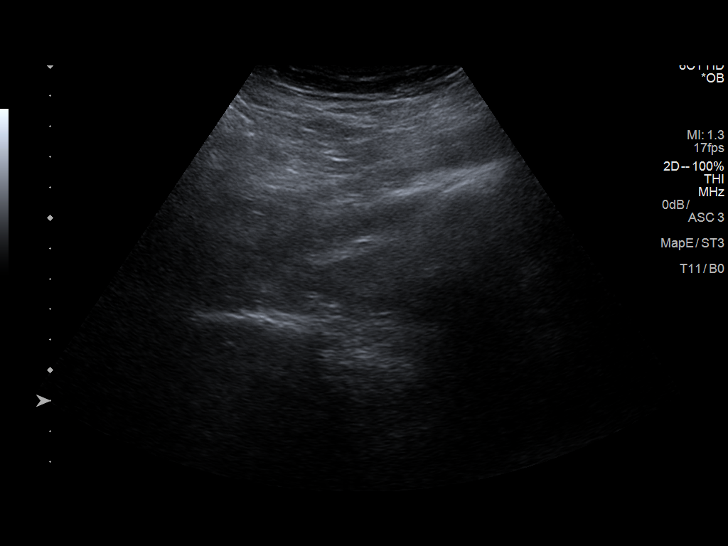
[im 32/96]
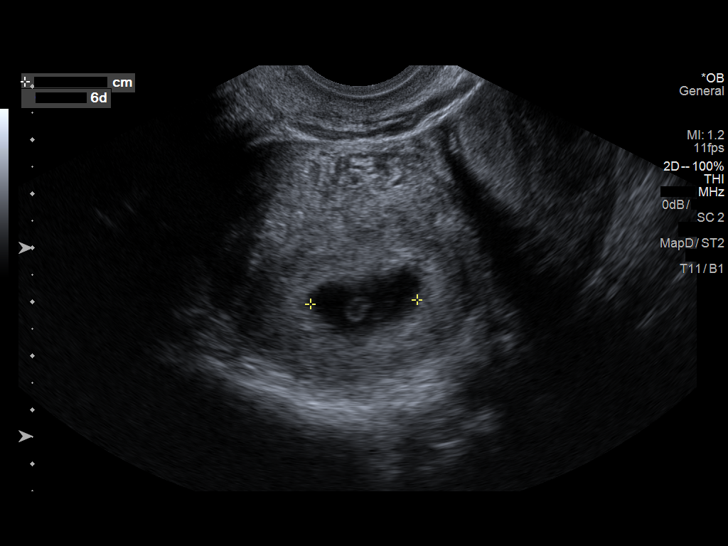
[im 39/96]
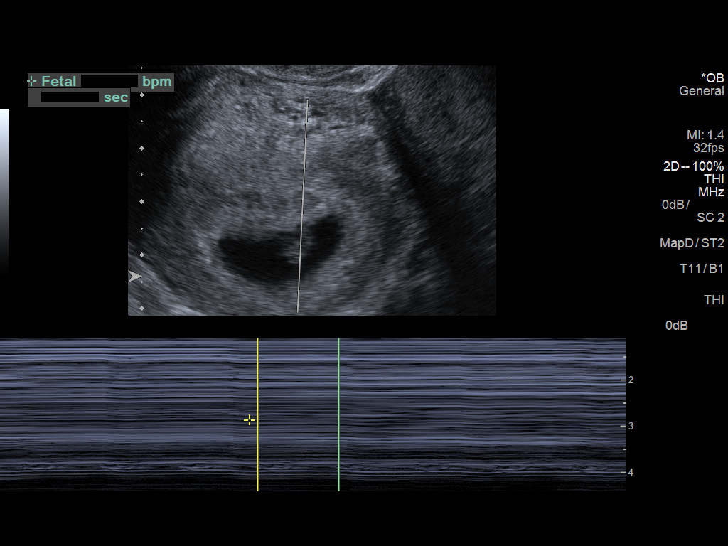
[im 46/96]
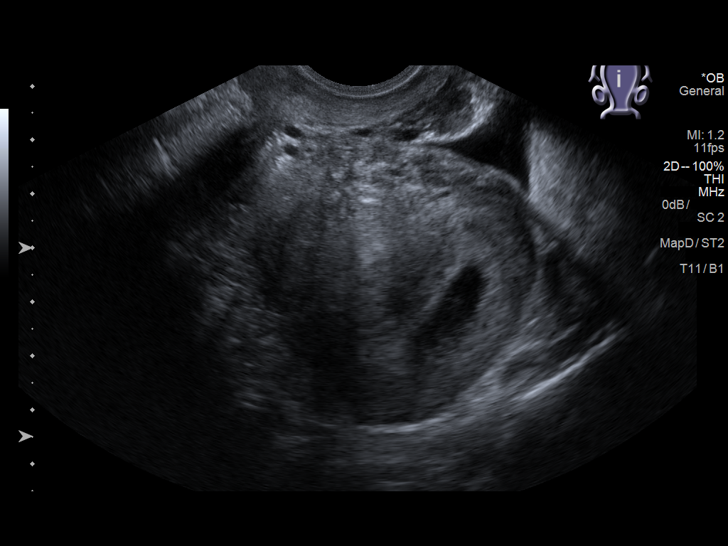
[im 53/96]
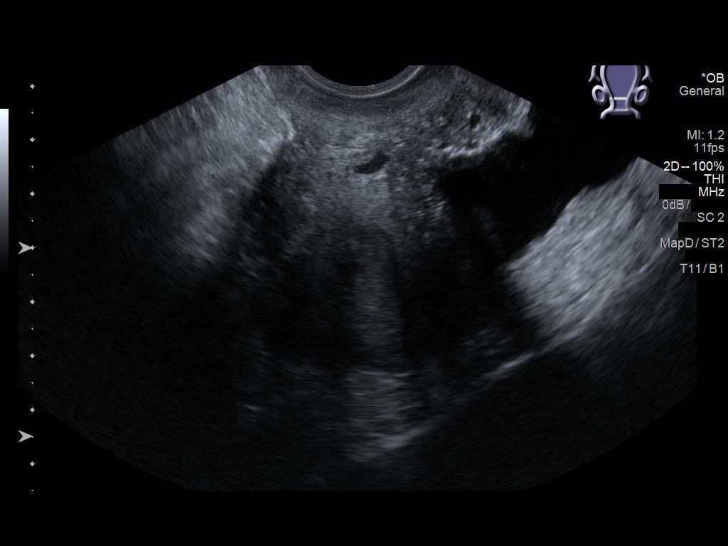
[im 60/96]
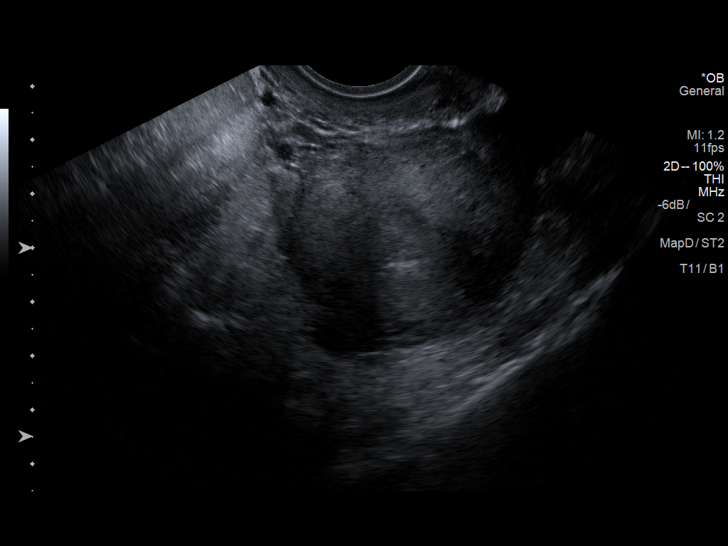
[im 67/96]
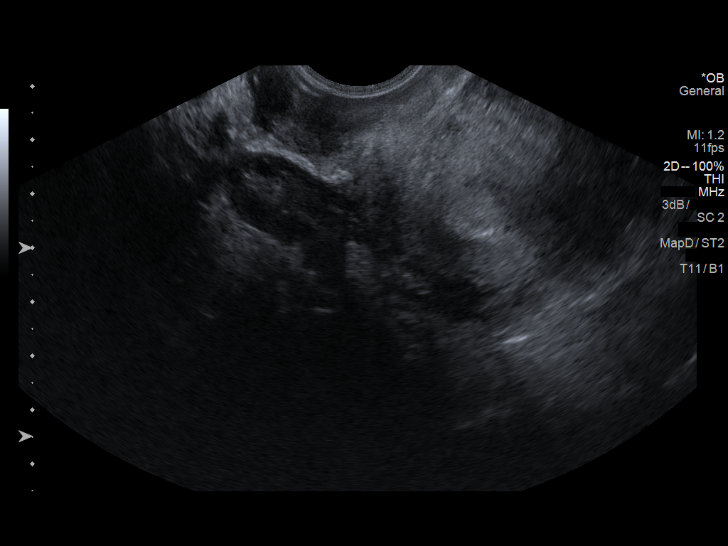
[im 74/96]
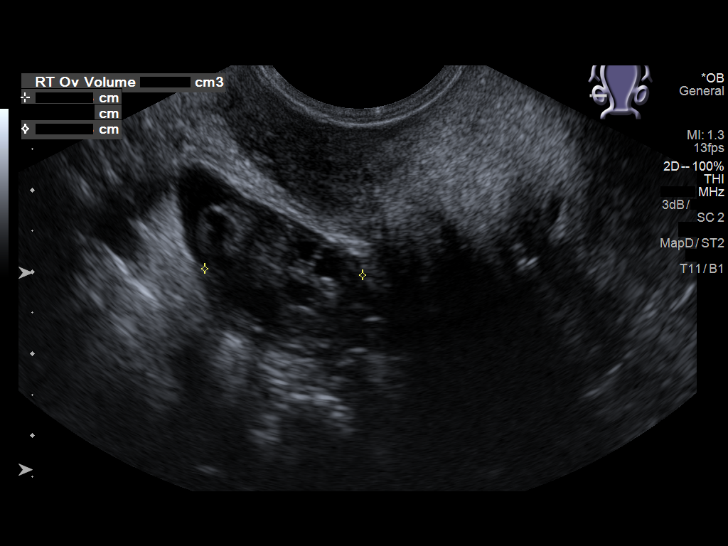
[im 81/96]
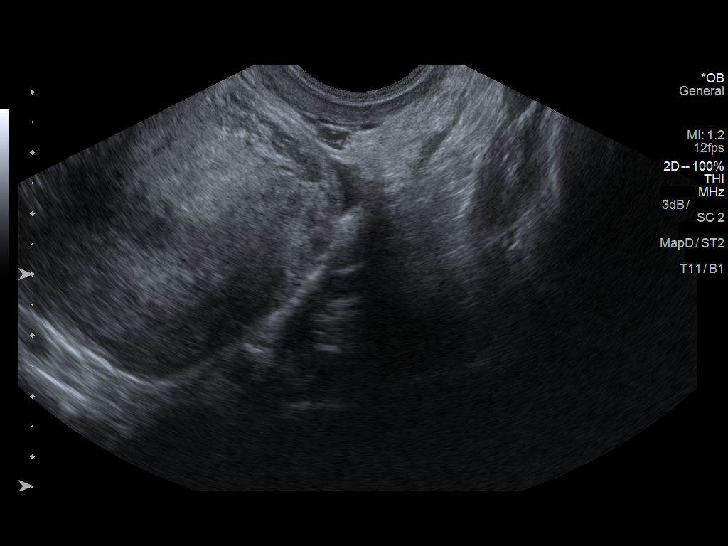
[im 88/96]
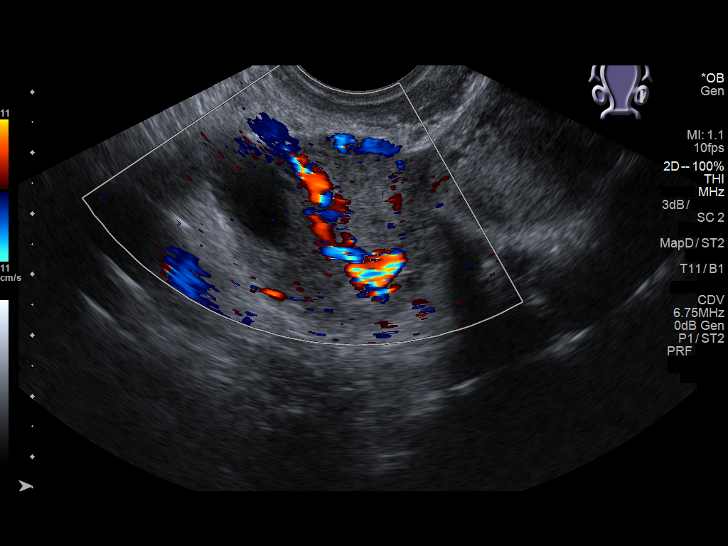
[im 96/96]
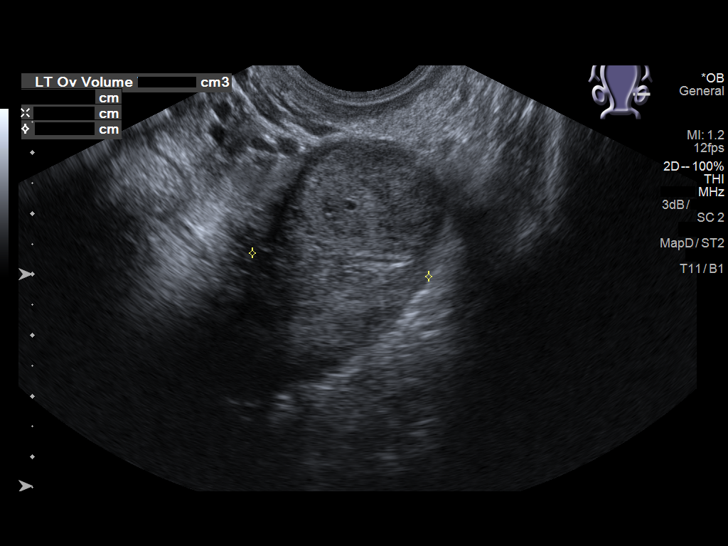

[14 of 28 positions shown; findings below may reference images not displayed]

FINDINGS: Intrauterine gestational sac: Visualized.

Yolk sac:  Visualized.

Embryo:  Visualized.

Cardiac Activity: Visualized.

Heart Rate: 126  bpm

CRL:  6.6  mm   6 w   4 d                  US EDC: 05/16/2018

Subchorionic hemorrhage:  None visualized.

Maternal uterus/adnexae: Ovaries are visualized. Corpus luteum cyst
on the left. There is also a septated cystic structure in the left
ovary, measuring 3.1 x 1.6 x 2.2 cm. Small free fluid.
IMPRESSION: 1. Single living intrauterine pregnancy with gestational age 6 weeks
4 days and estimated date of confinement of 05/16/2018.
2. Possible hemorrhagic cyst in the left ovary. Continued attention
on followup exams is warranted as malignancy cannot be definitively
excluded.
3. Small free fluid.

## 2020-11-04 ENCOUNTER — Other Ambulatory Visit (HOSPITAL_COMMUNITY): Payer: Self-pay | Admitting: Neurology

## 2020-11-04 ENCOUNTER — Other Ambulatory Visit: Payer: Self-pay | Admitting: Neurology

## 2020-11-04 DIAGNOSIS — R42 Dizziness and giddiness: Secondary | ICD-10-CM

## 2020-11-08 ENCOUNTER — Ambulatory Visit
Admission: RE | Admit: 2020-11-08 | Discharge: 2020-11-08 | Disposition: A | Discharge status: Home / Self Care | Payer: Medicaid Other | Source: Ambulatory Visit | Attending: Neurology | Admitting: Neurology

## 2020-11-08 ENCOUNTER — Other Ambulatory Visit: Payer: Self-pay

## 2020-11-08 DIAGNOSIS — R42 Dizziness and giddiness: Secondary | ICD-10-CM | POA: Diagnosis not present

## 2020-12-31 ENCOUNTER — Emergency Department
Admission: EM | Admit: 2020-12-31 | Discharge: 2020-12-31 | Disposition: A | Discharge status: Home / Self Care | Payer: Medicaid Other | Attending: Emergency Medicine | Admitting: Emergency Medicine

## 2020-12-31 ENCOUNTER — Emergency Department: Payer: Medicaid Other

## 2020-12-31 ENCOUNTER — Other Ambulatory Visit: Payer: Self-pay

## 2020-12-31 DIAGNOSIS — R200 Anesthesia of skin: Secondary | ICD-10-CM

## 2020-12-31 DIAGNOSIS — Z8541 Personal history of malignant neoplasm of cervix uteri: Secondary | ICD-10-CM | POA: Diagnosis not present

## 2020-12-31 DIAGNOSIS — G43909 Migraine, unspecified, not intractable, without status migrainosus: Secondary | ICD-10-CM | POA: Diagnosis not present

## 2020-12-31 LAB — CBC
HCT: 40.8 % (ref 36.0–46.0)
Hemoglobin: 13.7 g/dL (ref 12.0–15.0)
MCH: 27.8 pg (ref 26.0–34.0)
MCHC: 33.6 g/dL (ref 30.0–36.0)
MCV: 82.8 fL (ref 80.0–100.0)
Platelets: 372 10*3/uL (ref 150–400)
RBC: 4.93 MIL/uL (ref 3.87–5.11)
RDW: 13.6 % (ref 11.5–15.5)
WBC: 11.6 10*3/uL — ABNORMAL HIGH (ref 4.0–10.5)
nRBC: 0 % (ref 0.0–0.2)

## 2020-12-31 LAB — COMPREHENSIVE METABOLIC PANEL
ALT: 24 U/L (ref 0–44)
AST: 19 U/L (ref 15–41)
Albumin: 4.4 g/dL (ref 3.5–5.0)
Alkaline Phosphatase: 48 U/L (ref 38–126)
Anion gap: 8 (ref 5–15)
BUN: 14 mg/dL (ref 6–20)
CO2: 26 mmol/L (ref 22–32)
Calcium: 9.4 mg/dL (ref 8.9–10.3)
Chloride: 105 mmol/L (ref 98–111)
Creatinine, Ser: 0.81 mg/dL (ref 0.44–1.00)
GFR, Estimated: >60 mL/min (ref >60)
Glucose, Bld: 95 mg/dL (ref 70–99)
Potassium: 3.9 mmol/L (ref 3.5–5.1)
Sodium: 139 mmol/L (ref 135–145)
Total Bilirubin: 0.6 mg/dL (ref 0.3–1.2)
Total Protein: 7.9 g/dL (ref 6.5–8.1)

## 2020-12-31 LAB — POC URINE PREG, ED: Preg Test, Ur: NEGATIVE

## 2020-12-31 LAB — PREGNANCY, URINE: Preg Test, Ur: NEGATIVE

## 2020-12-31 MED ORDER — PROCHLORPERAZINE EDISYLATE 10 MG/2ML IJ SOLN
10.0000 mg | Freq: Once | INTRAMUSCULAR | Status: AC
  Administered 2020-12-31: 10 mg via INTRAVENOUS
  Filled 2020-12-31: qty 2

## 2020-12-31 MED ORDER — SODIUM CHLORIDE 0.9 % IV BOLUS
1000.0000 mL | Freq: Once | INTRAVENOUS | Status: AC
  Administered 2020-12-31: 1000 mL via INTRAVENOUS

## 2020-12-31 NOTE — ED Provider Notes (Signed)
[facility] Emergency Department Provider Note   ____________________________________________   I have reviewed the triage vital signs and the nursing notes.   HISTORY  Chief Complaint Numbness (Right side of face and neck)   History limited by: Not Limited   HPI Brenda Fox is a 30 y.o. female who presents to the emergency department today because of concern for right sided facial numbness. The patient states that it started today. Prior to the numbness she had developed a headache. Located on the right side it radiated from the back to the front. The patient states that she has been having issues with headaches and dizziness for a while. Has seen both cardiology and neurology for this issue. She has never had the numbness however. She denies any associated weakness to her face or extremities. The patient denies any fevers.     Records reviewed. Per medical record review patient has a history of recent brain MRI for dizziness which was without concerning findings.   Past Medical History:  Diagnosis Date  . Cancer of cervix [facility])     Patient Active Problem List   Diagnosis Date Noted  . Indication for care in labor and delivery, antepartum 04/29/2018  . Preterm uterine contractions in second trimester, antepartum 02/06/2018  . Pregnancy 12/29/2017  . Hyperemesis arising during pregnancy 10/21/2017    Past Surgical History:  Procedure Laterality Date  . procedure for cervical cancer      Prior to Admission medications   Medication Sig Start Date End Date Taking? Authorizing Provider  acetaminophen (TYLENOL) 325 MG tablet Take 2 tablets (650 mg total) by mouth every 4 (four) hours as needed (for pain scale < 4). Patient not taking: Reported on 06/21/2019 05/01/18   McVey, Murray Hodgkins, CNM  ibuprofen (ADVIL,MOTRIN) 600 MG tablet Take 1 tablet (600 mg total) by mouth every 6 (six) hours. Patient not taking: Reported on 06/21/2019 05/01/18   McVey, Murray Hodgkins,  CNM  senna-docusate (SENOKOT-S) 8.6-50 MG tablet Take 2 tablets by mouth daily. Patient not taking: Reported on 06/21/2019 05/02/18   McVey, Murray Hodgkins, CNM    Allergies Patient has no known allergies.  History reviewed. No pertinent family history.  Social History Social History   Tobacco Use  . Smoking status: Never Smoker  . Smokeless tobacco: Never Used  Substance Use Topics  . Alcohol use: No  . Drug use: No    Review of Systems Constitutional: No fever/chills Eyes: No visual changes. ENT: No sore throat. Cardiovascular: Denies chest pain. Respiratory: Denies shortness of breath. Gastrointestinal: No abdominal pain.  No nausea, no vomiting.  No diarrhea.   Genitourinary: Negative for dysuria. Musculoskeletal: Negative for back pain. Skin: Negative for rash. Neurological: Positive for headache and right facial numbness.  ____________________________________________   PHYSICAL EXAM:  VITAL SIGNS: ED Triage Vitals  Enc Vitals Group     BP 12/31/20 1940 123/73     Pulse Rate 12/31/20 1940 79     Resp 12/31/20 1940 18     Temp 12/31/20 1940 98.6 F (37 C)     Temp Source 12/31/20 1940 Oral     SpO2 12/31/20 1940 100 %     Weight 12/31/20 1942 185 lb (83.9 kg)     Height 12/31/20 1942 5\' 5"  (1.651 m)     Head Circumference --      Peak Flow --      Pain Score 12/31/20 1941 9   Constitutional: Alert and oriented.  Eyes: Conjunctivae are normal.  ENT      Head: Normocephalic and atraumatic.      Nose: No congestion/rhinnorhea.      Mouth/Throat: Mucous membranes are moist.      Neck: No stridor. Hematological/Lymphatic/Immunilogical: No cervical lymphadenopathy. Cardiovascular: Normal rate, regular rhythm.  No murmurs, rubs, or gallops.  Respiratory: Normal respiratory effort without tachypnea nor retractions. Breath sounds are clear and equal bilaterally. No wheezes/rales/rhonchi. Gastrointestinal: Soft and non tender. No rebound. No guarding.   Genitourinary: Deferred Musculoskeletal: Normal range of motion in all extremities. No lower extremity edema. Neurologic:  Normal speech and language. Face symmetric. EOMI. Strength 5/5 in upper and lower extremities. Subjective numbness to right face.  Skin:  Skin is warm, dry and intact. No rash noted. Psychiatric: Mood and affect are normal. Speech and behavior are normal. Patient exhibits appropriate insight and judgment.  ____________________________________________    LABS (pertinent positives/negatives)  Upreg negative CBC wbc 11.6, hgb 13.7, plt 372 CMP wnl  ____________________________________________   EKG  None  ____________________________________________    RADIOLOGY  CT head No acute intracranial abnormality noted  ____________________________________________   PROCEDURES  Procedures  ____________________________________________   INITIAL IMPRESSION / ASSESSMENT AND PLAN / ED COURSE  Pertinent labs & imaging results that were available during my care of the patient were reviewed by me and considered in my medical decision making (see chart for details).   Patient presented to the emergency department today because of concerns for right facial numbness as well as headache.  Patient denied any other weakness or numbness and physical exam is fairly benign.  Head CT was obtained which not show any concerning abnormalities.  I did have thoughts that this could be a complex migraine.  She did feel better after migraine medication with resolution of her headache and numbness.  At this time I doubt stroke.  Given that she feels improvement will discharge home.  She already is following with a neurologist and I did recommend continued follow-up.  ____________________________________________   FINAL CLINICAL IMPRESSION(S) / ED DIAGNOSES  Final diagnoses:  Migraine without status migrainosus, not intractable, unspecified migraine type  Right facial numbness      Note: This dictation was prepared with Dragon dictation. Any transcriptional errors that result from this process are unintentional     Nance Pear, MD 12/31/20 2252

## 2020-12-31 NOTE — ED Triage Notes (Signed)
Pt presents to ER c/o right sided face and neck numbness since appx 1300 today.  Pt denies any unilateral deficits at this time.  Pt does not have slurred speech noted but does state she has headache.  Pt A&O x4 at this time.

## 2020-12-31 NOTE — Discharge Instructions (Addendum)
Please seek medical attention for any high fevers, chest pain, shortness of breath, change in behavior, persistent vomiting, bloody stool or any other new or concerning symptoms.  

## 2021-04-07 ENCOUNTER — Other Ambulatory Visit: Payer: Self-pay

## 2021-04-07 ENCOUNTER — Encounter: Payer: Self-pay | Admitting: Emergency Medicine

## 2021-04-07 DIAGNOSIS — R300 Dysuria: Secondary | ICD-10-CM | POA: Diagnosis not present

## 2021-04-07 DIAGNOSIS — Z8541 Personal history of malignant neoplasm of cervix uteri: Secondary | ICD-10-CM | POA: Insufficient documentation

## 2021-04-07 DIAGNOSIS — R309 Painful micturition, unspecified: Secondary | ICD-10-CM | POA: Insufficient documentation

## 2021-04-07 DIAGNOSIS — R1030 Lower abdominal pain, unspecified: Secondary | ICD-10-CM | POA: Insufficient documentation

## 2021-04-07 NOTE — ED Triage Notes (Signed)
Pt in via POV, reports dysuria, urinary frequency, and vaginal discomfort x 2 days.  Denies any abnormal bleeding or discharge.  Ambulatory to triage, vitals WDL, NAD noted at this time.

## 2021-04-08 ENCOUNTER — Emergency Department
Admission: EM | Admit: 2021-04-08 | Discharge: 2021-04-08 | Disposition: A | Discharge status: Home / Self Care | Payer: Medicaid Other | Attending: Emergency Medicine | Admitting: Emergency Medicine

## 2021-04-08 DIAGNOSIS — R300 Dysuria: Secondary | ICD-10-CM

## 2021-04-08 LAB — URINALYSIS, COMPLETE (UACMP) WITH MICROSCOPIC
Bacteria, UA: NONE SEEN
Bilirubin Urine: NEGATIVE
Glucose, UA: NEGATIVE mg/dL
Ketones, ur: NEGATIVE mg/dL
Leukocytes,Ua: NEGATIVE
Nitrite: NEGATIVE
Protein, ur: NEGATIVE mg/dL
Specific Gravity, Urine: 1.005 (ref 1.005–1.030)
pH: 6 (ref 5.0–8.0)

## 2021-04-08 LAB — WET PREP, GENITAL
Clue Cells Wet Prep HPF POC: NONE SEEN
Sperm: NONE SEEN
Trich, Wet Prep: NONE SEEN
Yeast Wet Prep HPF POC: NONE SEEN

## 2021-04-08 LAB — POC URINE PREG, ED: Preg Test, Ur: NEGATIVE

## 2021-04-08 LAB — CHLAMYDIA/NGC RT PCR (ARMC ONLY)
Chlamydia Tr: NOT DETECTED
N gonorrhoeae: NOT DETECTED

## 2021-04-08 MED ORDER — FOSFOMYCIN TROMETHAMINE 3 G PO PACK
3.0000 g | PACK | Freq: Once | ORAL | Status: AC
  Administered 2021-04-08: 3 g via ORAL
  Filled 2021-04-08: qty 3

## 2021-04-08 NOTE — ED Provider Notes (Signed)
[facility] Emergency Department Provider Note  ____________________________________________   Event Date/Time   First MD Initiated Contact with Patient 04/08/21 [phone]     (approximate)  I have reviewed the triage vital signs and the nursing notes.   HISTORY  Chief Complaint Dysuria    HPI Brenda Fox is a 30 y.o. female who presents for evaluation of burning when she urinates.  She said it has been going on for 2 days.  She has had similar symptoms in the past and has been told that she has a urinary tract infection.  She states that she has no concerns for sexually transmitted disease.  She has not had increased vaginal discharge but she said that she feels "swollen on the inside".  She has some suprapubic burning pain when she urinates but otherwise has no abdominal pain.  She denies nausea and vomiting.  She denies fever, shortness of breath, and chest pain.  Nothing in particular makes it better and urination makes it worse.     Past Medical History:  Diagnosis Date   Cancer of cervix [facility])     Patient Active Problem List   Diagnosis Date Noted   Indication for care in labor and delivery, antepartum 04/29/2018   Preterm uterine contractions in second trimester, antepartum 02/06/2018   Pregnancy 12/29/2017   Hyperemesis arising during pregnancy 10/21/2017    Past Surgical History:  Procedure Laterality Date   procedure for cervical cancer      Prior to Admission medications   Medication Sig Start Date End Date Taking? Authorizing Provider  acetaminophen (TYLENOL) 325 MG tablet Take 2 tablets (650 mg total) by mouth every 4 (four) hours as needed (for pain scale < 4). Patient not taking: Reported on 06/21/2019 05/01/18   McVey, Murray Hodgkins, CNM  ibuprofen (ADVIL,MOTRIN) 600 MG tablet Take 1 tablet (600 mg total) by mouth every 6 (six) hours. Patient not taking: Reported on 06/21/2019 05/01/18   McVey, Murray Hodgkins, CNM  senna-docusate (SENOKOT-S)  8.6-50 MG tablet Take 2 tablets by mouth daily. Patient not taking: Reported on 06/21/2019 05/02/18   McVey, Murray Hodgkins, CNM    Allergies Patient has no known allergies.  No family history on file.  Social History Social History   Tobacco Use   Smoking status: Never   Smokeless tobacco: Never  Vaping Use   Vaping Use: Never used  Substance Use Topics   Alcohol use: No   Drug use: No    Review of Systems Constitutional: No fever/chills Cardiovascular: Denies chest pain. Respiratory: Denies shortness of breath. Gastrointestinal: Burning suprapubic abdominal pain when urinating.  No nausea nor vomiting. Genitourinary: Positive for dysuria.  Denies vaginal complaints and concerns. Neurological: Negative for headaches, focal weakness or numbness.   ____________________________________________   PHYSICAL EXAM:  VITAL SIGNS: ED Triage Vitals  Enc Vitals Group     BP 04/07/21 2247 118/74     Pulse Rate 04/07/21 2247 70     Resp 04/07/21 2247 15     Temp 04/07/21 2247 98.6 F (37 C)     Temp Source 04/07/21 2247 Oral     SpO2 04/07/21 2247 99 %     Weight 04/07/21 2248 81.6 kg (180 lb)     Height 04/07/21 2248 1.651 m ('5\' 5"'$ )     Head Circumference --      Peak Flow --      Pain Score 04/07/21 2247 4     Pain Loc --  Pain Edu? --      Excl. in [place]? --     Constitutional: Alert and oriented.  Eyes: Conjunctivae are normal.  Head: Atraumatic. Cardiovascular: Normal rate, regular rhythm. Good peripheral circulation. Respiratory: Normal respiratory effort.  No retractions. Gastrointestinal: Soft and nontender. No distention.  Genitourinary: Normal external exam with no evidence of rash nor infection.  Moderate amount of whitish discharge in the vaginal vault.  Normal-appearing cervix with no evidence of cervicitis.  Patient reports some tenderness on the superior wall of the vagina on bimanual exam but there is no cervical motion tenderness.  No adnexal tenderness.   ED nurse present throughout exam as chaperone. Neurologic:  Normal speech and language. No gross focal neurologic deficits are appreciated.  Skin:  Skin is warm, dry and intact. Psychiatric: Mood and affect are normal. Speech and behavior are normal.  ____________________________________________   LABS (all labs ordered are listed, but only abnormal results are displayed)  Labs Reviewed  WET PREP, GENITAL - Abnormal; Notable for the following components:      Result Value   WBC, Wet Prep HPF POC FEW (*)    All other components within normal limits  URINALYSIS, COMPLETE (UACMP) WITH MICROSCOPIC - Abnormal; Notable for the following components:   Color, Urine STRAW (*)    APPearance CLEAR (*)    Hgb urine dipstick SMALL (*)    All other components within normal limits  URINE CULTURE  CHLAMYDIA/NGC RT PCR ([facility] ONLY)            POC URINE PREG, ED   ____________________________________________   INITIAL IMPRESSION / MDM / ASSESSMENT AND PLAN / ED COURSE  As part of my medical decision making, I reviewed the following data within the [facility] notes reviewed and incorporated, Labs reviewed , Old chart reviewed, and Notes from prior ED visits   Differential diagnosis includes, but is not limited to, urinary tract infection, yeast infection, STD, skin irritation/dermatitis.  Urinalysis is generally reassuring; she has a few white cells but no bacteria seen and is nitrite negative.  However, she is symptomatic, and may benefit from treatment.  Urine culture is pending.  Urine pregnancy test is negative.  The patient would like to proceed with a pelvic exam so I will check GC/chlamydia as well as a wet prep to see if she needs treatment for additional conditions as well.       Clinical Course as of 04/08/21 0316  Fri Apr 08, 2021  0315 Wet prep, genital(!) Wet prep unremarkable.  Very low probability of GC chlamydia.  Patient is ready to go, I treated for  mild symptomatic UTI with a one-time dose of fosfomycin 3 g p.o.  I updated the patient that she does not need a prescription and encourage close follow-up.  I gave my usual and customary return precautions. [CF]    Clinical Course User Index [CF] Hinda Kehr, MD     ____________________________________________  FINAL CLINICAL IMPRESSION(S) / ED DIAGNOSES  Final diagnoses:  Dysuria     MEDICATIONS GIVEN DURING THIS VISIT:  Medications  fosfomycin (MONUROL) packet 3 g (3 g Oral Given 04/08/21 [id number])     ED Discharge Orders     None        Note:  This document was prepared using Dragon voice recognition software and may include unintentional dictation errors.   Hinda Kehr, MD 04/08/21 [phone]

## 2021-04-08 NOTE — Discharge Instructions (Addendum)
Your work-up was generally reassuring today.  We treated you with a one-time medicine called fosfomycin which is really good at treating mild urinary tract infections.  You do not need a prescription for more antibiotics.  Please follow-up with your regular doctor if you continue having symptoms.  Return to the emergency department if you develop new or worsening symptoms that concern you.

## 2021-04-10 LAB — URINE CULTURE: Culture: 40000 — AB

## 2021-10-14 ENCOUNTER — Other Ambulatory Visit: Payer: Self-pay

## 2021-10-14 ENCOUNTER — Emergency Department
Admission: EM | Admit: 2021-10-14 | Discharge: 2021-10-14 | Disposition: A | Discharge status: Home / Self Care | Payer: Medicaid Other | Attending: Emergency Medicine | Admitting: Emergency Medicine

## 2021-10-14 DIAGNOSIS — R109 Unspecified abdominal pain: Secondary | ICD-10-CM | POA: Diagnosis not present

## 2021-10-14 DIAGNOSIS — M791 Myalgia, unspecified site: Secondary | ICD-10-CM | POA: Diagnosis present

## 2021-10-14 DIAGNOSIS — M549 Dorsalgia, unspecified: Secondary | ICD-10-CM | POA: Insufficient documentation

## 2021-10-14 DIAGNOSIS — U071 COVID-19: Secondary | ICD-10-CM | POA: Diagnosis not present

## 2021-10-14 LAB — URINALYSIS, ROUTINE W REFLEX MICROSCOPIC
Bilirubin Urine: NEGATIVE
Glucose, UA: NEGATIVE mg/dL
Ketones, ur: >80 mg/dL — AB
Leukocytes,Ua: NEGATIVE
Nitrite: NEGATIVE
Protein, ur: NEGATIVE mg/dL
Specific Gravity, Urine: 1.025 (ref 1.005–1.030)
pH: 5.5 (ref 5.0–8.0)

## 2021-10-14 LAB — BASIC METABOLIC PANEL
Anion gap: 9 (ref 5–15)
BUN: 12 mg/dL (ref 6–20)
CO2: 25 mmol/L (ref 22–32)
Calcium: 9.3 mg/dL (ref 8.9–10.3)
Chloride: 104 mmol/L (ref 98–111)
Creatinine, Ser: 0.78 mg/dL (ref 0.44–1.00)
GFR, Estimated: >60 mL/min (ref >60)
Glucose, Bld: 123 mg/dL — ABNORMAL HIGH (ref 70–99)
Potassium: 3.7 mmol/L (ref 3.5–5.1)
Sodium: 138 mmol/L (ref 135–145)

## 2021-10-14 LAB — CBC
HCT: 42.5 % (ref 36.0–46.0)
Hemoglobin: 13.8 g/dL (ref 12.0–15.0)
MCH: 27.1 pg (ref 26.0–34.0)
MCHC: 32.5 g/dL (ref 30.0–36.0)
MCV: 83.3 fL (ref 80.0–100.0)
Platelets: 346 10*3/uL (ref 150–400)
RBC: 5.1 MIL/uL (ref 3.87–5.11)
RDW: 13.2 % (ref 11.5–15.5)
WBC: 14.7 10*3/uL — ABNORMAL HIGH (ref 4.0–10.5)
nRBC: 0 % (ref 0.0–0.2)

## 2021-10-14 LAB — RESP PANEL BY RT-PCR (FLU A&B, COVID) ARPGX2
Influenza A by PCR: NEGATIVE
Influenza B by PCR: NEGATIVE
SARS Coronavirus 2 by RT PCR: POSITIVE — AB

## 2021-10-14 MED ORDER — KETOROLAC TROMETHAMINE 30 MG/ML IJ SOLN
30.0000 mg | Freq: Once | INTRAMUSCULAR | Status: AC
  Administered 2021-10-14: 30 mg via INTRAMUSCULAR
  Filled 2021-10-14: qty 1

## 2021-10-14 MED ORDER — ACETAMINOPHEN 325 MG PO TABS
650.0000 mg | ORAL_TABLET | Freq: Once | ORAL | Status: AC
  Administered 2021-10-14: 650 mg via ORAL
  Filled 2021-10-14: qty 2

## 2021-10-14 MED ORDER — CYCLOBENZAPRINE HCL 10 MG PO TABS
5.0000 mg | ORAL_TABLET | Freq: Once | ORAL | Status: AC
  Administered 2021-10-14: 5 mg via ORAL
  Filled 2021-10-14: qty 1

## 2021-10-14 MED ORDER — CYCLOBENZAPRINE HCL 5 MG PO TABS: 5.0000 mg | ORAL_TABLET | Freq: Three times a day (TID) | ORAL | 0 refills | Status: AC | PRN

## 2021-10-14 MED ORDER — NIRMATRELVIR/RITONAVIR (PAXLOVID)TABLET: 3.0000 | ORAL_TABLET | Freq: Two times a day (BID) | ORAL | 0 refills | Status: AC

## 2021-10-14 NOTE — ED Triage Notes (Signed)
Pt to ED For generalized body aches, upset stomach that started this morning. Reports kids are sick. Took zofran PTA.

## 2021-10-14 NOTE — ED Provider Notes (Signed)
[facility] Provider Note  Patient Contact: 3:23 PM (approximate)   History   flu like symptoms  HPI  Brenda Fox is a 31 y.o. female with noncontributory medical history, presents to the ED for evaluation of generalized body aches, malaise, and upset stomach.  Patient notes nausea for which she took Zofran prior to arrival.  She reports similar symptoms in her children.  Denies any frank fevers, cough, congestion, shortness of breath, or chest pain. She does endorse back pain, diarrhea, and abdominal cramping.      Physical Exam   Triage Vital Signs: ED Triage Vitals [10/14/21 1415]  Enc Vitals Group     BP (!) 109/43     Pulse Rate (!) 109     Resp 20     Temp 100 F (37.8 C)     Temp src      SpO2 98 %     Weight 200 lb (90.7 kg)     Height 5\' 5"  (1.651 m)     Head Circumference      Peak Flow      Pain Score 8     Pain Loc      Pain Edu?      Excl. in [place]?     Most recent vital signs: Vitals:   10/14/21 1415 10/14/21 1634  BP: (!) 109/43 112/78  Pulse: (!) 109 (!) 120  Resp: 20 17  Temp: 100 F (37.8 C) 99.8 F (37.7 C)  SpO2: 98% 99%     General: Alert and in no acute distress. Head: No acute traumatic findings Eyes:  PERRL. EOMI. Cardiovascular:  Good peripheral perfusion Respiratory: Normal respiratory effort without tachypnea or retractions. Lungs CTAB.  Gastrointestinal: Bowel sounds 4 quadrants. Soft and nontender to palpation. No guarding or rigidity. No palpable masses. No distention. Mild right CVA tenderness. Musculoskeletal: Full range of motion to all extremities.  Neurologic:  No gross focal neurologic deficits are appreciated.  Skin:   No rash noted Other:   ED Results / Procedures / Treatments   Labs (all labs ordered are listed, but only abnormal results are displayed) Labs Reviewed  RESP PANEL BY RT-PCR (FLU A&B, COVID) ARPGX2 - Abnormal; Notable for the following components:      Result Value   SARS  Coronavirus 2 by RT PCR POSITIVE (*)    All other components within normal limits  CBC - Abnormal; Notable for the following components:   WBC 14.7 (*)    All other components within normal limits  BASIC METABOLIC PANEL - Abnormal; Notable for the following components:   Glucose, Bld 123 (*)    All other components within normal limits  URINALYSIS, ROUTINE W REFLEX MICROSCOPIC - Abnormal; Notable for the following components:   APPearance CLEAR (*)    Hgb urine dipstick TRACE (*)    Ketones, ur >80 (*)    Bacteria, UA RARE (*)    All other components within normal limits     EKG   RADIOLOGY  No results found.  PROCEDURES:  Critical Care performed: No  Procedures   MEDICATIONS ORDERED IN ED: Medications  acetaminophen (TYLENOL) tablet 650 mg (650 mg Oral Given 10/14/21 1602)  ketorolac (TORADOL) 30 MG/ML injection 30 mg (30 mg Intramuscular Given 10/14/21 1631)  cyclobenzaprine (FLEXERIL) tablet 5 mg (5 mg Oral Given 10/14/21 1631)     IMPRESSION / MDM / ASSESSMENT AND PLAN / ED COURSE  I reviewed the triage vital signs and the nursing  notes.                              Differential diagnosis includes, but is not limited to, viral URI, influenza, Covid, AOM, strep throat, UTI, pyelonephritis, viral gastroenteritis.  Patient with ED evaluation of generalized myalgias, back pain, and episodic nausea and vomiting presented to the ED.  Patient with similar symptoms in her children, presents for evaluation of sudden onset of symptoms today.  She was evaluated for complaints, and found to have a viral panel screen that does confirm COVID.  CBC shows elevated white count of 14, and a patient that by chart likely has a persistent leukocytosis.  No electrolyte abnormality on BMP, and UA does not show any hematuria or pyuria.  Mild ketonuria consistent with the patient's history of some nausea and vomiting.  Patient otherwise is stable at this time, with symptoms resolved after ED  medication administration.  She will be discharged from the ED with a prescription for Paxlovid.  Patient's diagnosis is consistent with coronavirus. Patient is to follow up with her PCP as needed or otherwise directed. Patient is given ED precautions to return to the ED for any worsening or new symptoms.   FINAL CLINICAL IMPRESSION(S) / ED DIAGNOSES   Final diagnoses:  [id number]     Rx / DC Orders   ED Discharge Orders          Ordered    nirmatrelvir/ritonavir EUA (PAXLOVID) 20 x 150 MG & 10 x 100MG  TABS  2 times daily        10/14/21 1619             Note:  This document was prepared using Dragon voice recognition software and may include unintentional dictation errors.    Melvenia Needles, PA-C 10/14/21 1656    Naaman Plummer, MD 10/14/21 [phone]

## 2021-10-14 NOTE — ED Notes (Signed)
Pt states she has had nausea, vomiting, fever, diarrhea and back pain. Pt children were sick first.

## 2021-10-14 NOTE — Discharge Instructions (Addendum)
Take the COVID antiviral medication as prescribed.  Continue to take your nausea medicine as needed.  Follow-up with your primary provider for ongoing symptoms.  Return to the ED if necessary.

## 2022-01-02 ENCOUNTER — Ambulatory Visit
Admission: RE | Admit: 2022-01-02 | Discharge: 2022-01-02 | Disposition: A | Discharge status: Home / Self Care | Payer: Medicaid Other | Source: Ambulatory Visit | Attending: Family Medicine | Admitting: Family Medicine

## 2022-01-02 ENCOUNTER — Ambulatory Visit
Admission: RE | Admit: 2022-01-02 | Discharge: 2022-01-02 | Disposition: A | Discharge status: Home / Self Care | Payer: Medicaid Other | Attending: Family Medicine | Admitting: Family Medicine

## 2022-01-02 ENCOUNTER — Other Ambulatory Visit: Payer: Self-pay | Admitting: Family Medicine

## 2022-01-02 DIAGNOSIS — R52 Pain, unspecified: Secondary | ICD-10-CM

## 2022-08-25 ENCOUNTER — Other Ambulatory Visit: Payer: Self-pay | Admitting: Family Medicine

## 2022-08-25 DIAGNOSIS — M79641 Pain in right hand: Secondary | ICD-10-CM

## 2022-09-01 ENCOUNTER — Ambulatory Visit
Admission: RE | Admit: 2022-09-01 | Discharge: 2022-09-01 | Disposition: A | Discharge status: Home / Self Care | Payer: Medicaid Other | Source: Ambulatory Visit | Attending: Family Medicine | Admitting: Family Medicine

## 2022-09-01 DIAGNOSIS — M79641 Pain in right hand: Secondary | ICD-10-CM | POA: Insufficient documentation

## 2023-01-16 ENCOUNTER — Encounter: Payer: Self-pay | Admitting: Occupational Therapy

## 2023-01-16 ENCOUNTER — Ambulatory Visit: Payer: Medicaid Other | Attending: Sports Medicine | Admitting: Occupational Therapy

## 2023-01-16 DIAGNOSIS — M25641 Stiffness of right hand, not elsewhere classified: Secondary | ICD-10-CM

## 2023-01-16 DIAGNOSIS — M6281 Muscle weakness (generalized): Secondary | ICD-10-CM

## 2023-01-16 DIAGNOSIS — M79641 Pain in right hand: Secondary | ICD-10-CM

## 2023-01-16 DIAGNOSIS — R600 Localized edema: Secondary | ICD-10-CM | POA: Diagnosis present

## 2023-01-16 NOTE — Therapy (Signed)
[facility] [address] [name], [address] Phone: [phone]   Fax:  [phone]  Occupational Therapy Evaluation  Patient Details  Name: Brenda Fox MRN: [id number] Date of Birth: 1991-06-30 Referring Provider (OT): Dr Landry Mellow   Encounter Date: 01/16/2023   OT End of Session - 01/16/23 2009     Visit Number 1    Number of Visits 16    Date for OT Re-Evaluation 03/13/23    OT Start Time 1030    OT Stop Time 1129    OT Time Calculation (min) 59 min    Activity Tolerance Patient tolerated treatment well;Patient limited by pain    Behavior During Therapy [facility] for tasks assessed/performed             Past Medical History:  Diagnosis Date   Cancer of cervix [facility])     Past Surgical History:  Procedure Laterality Date   procedure for cervical cancer      There were no vitals filed for this visit.   Subjective Assessment - 01/16/23 2001     Subjective  I had this pain since year ago- and swelling and pain increase with anything I do - even doing my hair, make up, cook, on computer, writing, dressing - in the middle of my hand    Pertinent History 12/13/22 Ortho note- was seen again 01/05/23 and refer to OT -  /Brenda Fox is a 32 y.o. female that presents to clinic today for evaluation and management of chronic hand pain and swelling at the referral of Elvin So, PA (neurology).  At the time of this visit, I reviewed her most recent evaluation by neurology for ongoing right hand pain suspected to be due to mild carpal tunnel syndrome seen on EMG from 10/30/2022. She has previously been evaluated by [facility] orthopedics on 09/26/2022 where she was recommended hand therapy, naproxen, and EMG for her MRI findings of mild multiple finger flexor tenosynovitis. She has initial evaluation by a local urgent care on 12/23/2021 after acute onset hand pain and swelling from her hand getting caught conveyor belt at work. She was  suspected to have a hand strain treated with ibuprofen 800 mg.  Her pain began almost 1 year ago when her hand was caught between a conveyor belt and metal. Since that time, she has had evaluation in multiple offices including MRI of the hand, EMG, x-ray. She has had treatment with naproxen, nortriptyline, tramadol, acetaminophen, brace, physical therapy (pivot; mostly given home exercise) with ongoing symptoms. There was question if her pain was coming from her underlying mild carpal tunnel syndrome so she was recommended consultation by orthopedics. The pain is located over her dorsal hand between the second and third metacarpal. Her pain can be deep to this area. She is starting to notice pain radiating up her dorsal forearm toward her elbow. She describes her pain as constant and dull, throbbing, aching, sharp, shooting. It is aggravated by using her hand, doing her hair, doing her make-up, writing. After she does these activities, she will notice swelling along her dorsal hand. She currently rates pain severity as a 4/10. She reports associated swelling, pain at night. She denies associated finger locking/catching, numbness or tingling, weakness, fevers or chills, night sweats, weight loss, skin color change. She feels like putting some pressure in the area does initially her feel better.  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    Patient Stated Goals Want my  hand better -the pain , swelling and use so I can use it at home , work    Currently in Pain? 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 , 4 and 18 yrs old kids, house work      AROM   Right Wrist Extension 70 Degrees   pain volar palm and wrist   Right Wrist Flexion 90 Degrees    Right Wrist Radial Deviation 20 Degrees    Right Wrist Ulnar Deviation 30 Degrees      Strength   Right Hand Grip (lbs) 20   pain 5/10   Right Hand Lateral Pinch 15 lbs   pain 5/10   Right Hand 3 Point Pinch 16 lbs   pain 5/10   Left Hand Grip (lbs) 56    Left Hand Lateral Pinch 19 lbs    Left Hand 3 Point Pinch 18 lbs      Right Hand AROM   R Thumb MCP 0-60 50 Degrees    R Thumb IP 0-80 55 Degrees    R Thumb Radial ABduction/ADduction 0-55 52   L 58   R Thumb Palmar ABduction/ADduction 0-45 46   L 60   R Thumb Opposition to Index --   pain to 4th and 5th - in thumb and palm                Patient fitted with a thumb spica splint to use most all the time off with ADLs and home exercises. Fitted patient with a CMC neoprene to use on the computer using the mouse. Patient to do 2-3 times a day contrast prior to pain-free gentle tendon glides and thumb palmar radial abduction 8-10 reps Opposition to all digits but picking up 1 or 2 cm foam block pain-free              OT Education - 01/16/23 2009     Education Details Findings of eval and HEP    Person(s) Educated Patient    Methods Explanation;Demonstration;Tactile cues;Verbal cues;Handout    Comprehension Verbal cues required;Returned demonstration;Verbalized understanding                 OT Long Term Goals - 01/16/23 2038       OT LONG TERM GOAL #1   Title Pt to be ind in HEP to decrease pain/edema iwith AROM of digits, thumb and wrist to less than 2/10    Baseline pain increase to 5/10 with grip , prehension and wrsit ext , thumb AROM in all planes and flexion/extention of digits-  tenderness in webspace and thenar eminence 10/10    Time 4    Period Weeks    Status New  Target Date 02/13/23      OT LONG TERM GOAL #2   Title Pt to show decrease edema and pain to less than 2/10 with bathing, dressing and grooming    Baseline pain and edema increase in R hand after using ADL'ls - increase to 8/10    Time 5    Period Weeks    Status New    Target Date 02/20/23      OT LONG TERM GOAL #3   Title Pain and edema improve in R hand and wrist to wean pt out of splints with ADL's    Baseline Thumb spica fitted to wear most all the time- [facility] neoprene with use of computer mouse - pain 5-8/10 pain in dorsal hand and thenar eminence and webspace    Time 6    Period Weeks    Status New    Target Date 02/27/23      OT LONG TERM GOAL #4   Title Pt verbalize 3 modifications to tasks to decrease edema and pain in ADL's and IADL's    Baseline pain 5-8/10 with AROM - increase edema after use and pain - no knowledge of modifcations    Time 6    Period Weeks    Status New    Target Date 02/27/23      OT LONG TERM GOAL #5   Title R grip and prehension strength increase to WNL for her age without increase symptoms to use hand more than 75%    Baseline grip 20 , lat 15 and 3 point 16 - pain 5/10  pain - pain increase to 8/10 tenderenss in thumb webspace -    Time 8    Period Weeks    Status New    Target Date 03/13/23                   Plan - 01/16/23 2011     Clinical Impression Statement Pt present at OT eval -refer with hx of chronic hand pain and swelling since April 23 - when her hand got caught between 2 objects on conveyer belt - EMG on 10/30/22 showed mild carpal tunnel syndrome. MRI earlier this year showed mild multiple finger flexor tenosynovitis. She has had evaluation in multiple offices including MRI of the hand, EMG, x-ray. She has had treatment with naproxen, nortriptyline, tramadol, acetaminophen, brace, physical therapy (pivot; mostly given home  exercise) with ongoing symptoms. Pt show increase tenderness over thenar eminence and between 2nd and 3rd MC- 10/10. Pain with composite extention and flexion of digits -but pain more with thumb PA and RA and opposition. Pain with prehension and grip strength - 5/10 pain -with increase swelling over thenar eminence and dorsal hand. Pt with increase edema ,pain and decrease ROM and strength all limiting her functional use of R dominant hand in ADL's and IADL's. Pt was fitting with thumb spica splint to use most all the time. Neoprene CMC for computer use.    OT Occupational Profile and History Comprehensive Assessment- Review of records and extensive additional review of physical, cognitive, psychosocial history related to current functional performance    Occupational performance deficits (Please refer to evaluation for details): ADL's;IADL's;Leisure;Rest and Sleep;Work;Play;Social Participation    Body Structure / Function / Physical Skills ADL;Decreased knowledge of use of DME;Flexibility;ROM;UE functional use;Dexterity;Edema;Strength;Pain;IADL    Rehab Potential Good    Clinical Decision Making Several treatment options, min-mod task modification necessary    Comorbidities Affecting Occupational Performance: May have comorbidities impacting occupational  performance   injury was year ago   Modification or Assistance to Complete Evaluation  No modification of tasks or assist necessary to complete eval    OT Frequency 2x / week    OT Duration 8 weeks    OT Treatment/Interventions Self-care/ADL training;Cryotherapy;Ultrasound;Iontophoresis;Paraffin;Fluidtherapy;Contrast Bath;DME and/or AE instruction;Manual Therapy;Passive range of motion;Splinting;Patient/family education;Dry needling;Therapeutic exercise    Consulted and Agree with Plan of Care Patient             Patient will benefit from skilled therapeutic intervention in order to improve the following deficits and impairments:   Body  Structure / Function / Physical Skills: ADL, Decreased knowledge of use of DME, Flexibility, ROM, UE functional use, Dexterity, Edema, Strength, Pain, IADL       Visit Diagnosis: Pain in right hand  Localized edema  Stiffness of right hand, not elsewhere classified  Muscle weakness (generalized)    Problem List Patient Active Problem List   Diagnosis Date Noted   Indication for care in labor and delivery, antepartum 04/29/2018   Preterm uterine contractions in second trimester, antepartum 02/06/2018   Pregnancy 12/29/2017   Hyperemesis arising during pregnancy 10/21/2017    Oletta Cohn, OTR/L,CLT 01/16/2023, 8:45 PM  [facility] [address], [name], [address] Phone: [phone]   Fax:  [phone]  Name: Brenda Fox MRN: [id number] Date of Birth: May 15, 1991

## 2023-01-18 ENCOUNTER — Ambulatory Visit: Payer: Medicaid Other | Attending: Sports Medicine | Admitting: Occupational Therapy

## 2023-01-18 DIAGNOSIS — R600 Localized edema: Secondary | ICD-10-CM | POA: Diagnosis present

## 2023-01-18 DIAGNOSIS — M6281 Muscle weakness (generalized): Secondary | ICD-10-CM | POA: Diagnosis present

## 2023-01-18 DIAGNOSIS — M79641 Pain in right hand: Secondary | ICD-10-CM

## 2023-01-18 DIAGNOSIS — M25641 Stiffness of right hand, not elsewhere classified: Secondary | ICD-10-CM | POA: Diagnosis present

## 2023-01-18 NOTE — Therapy (Signed)
[facility] [address], [name], [address] Phone: [phone]   Fax:  [phone]  Occupational Therapy Treatment  Patient Details  Name: Brenda Fox MRN: [id number] Date of Birth: 1991-03-04 Referring Provider (OT): Dr Landry Mellow   Encounter Date: 01/18/2023   OT End of Session - 01/18/23 2107     Visit Number 2    Number of Visits 16    Date for OT Re-Evaluation 03/13/23    OT Start Time 0945    OT Stop Time 1035    OT Time Calculation (min) 50 min    Activity Tolerance Patient tolerated treatment well;Patient limited by pain    Behavior During Therapy [facility] for tasks assessed/performed             Past Medical History:  Diagnosis Date   Cancer of cervix [facility])     Past Surgical History:  Procedure Laterality Date   procedure for cervical cancer      There were no vitals filed for this visit.   Subjective Assessment - 01/18/23 2105     Subjective  My hand was hurting some after last time- do have increase pain after dong the exercises but the splints feels good - support that thumb part    Pertinent History 12/13/22 Ortho note- was seen again 01/05/23 and refer to OT -  /Brenda Fox is a 32 y.o. female that presents to clinic today for evaluation and management of chronic hand pain and swelling at the referral of Elvin So, PA (neurology).  At the time of this visit, I reviewed her most recent evaluation by neurology for ongoing right hand pain suspected to be due to mild carpal tunnel syndrome seen on EMG from 10/30/2022. She has previously been evaluated by [facility] orthopedics on 09/26/2022 where she was recommended hand therapy, naproxen, and EMG for her MRI findings of mild multiple finger flexor tenosynovitis. She has initial evaluation by a local urgent care on 12/23/2021 after acute onset hand pain and swelling from her hand getting caught conveyor belt at work. She was suspected to have a hand strain treated  with ibuprofen 800 mg.  Her pain began almost 1 year ago when her hand was caught between a conveyor belt and metal. Since that time, she has had evaluation in multiple offices including MRI of the hand, EMG, x-ray. She has had treatment with naproxen, nortriptyline, tramadol, acetaminophen, brace, physical therapy (pivot; mostly given home exercise) with ongoing symptoms. There was question if her pain was coming from her underlying mild carpal tunnel syndrome so she was recommended consultation by orthopedics. The pain is located over her dorsal hand between the second and third metacarpal. Her pain can be deep to this area. She is starting to notice pain radiating up her dorsal forearm toward her elbow. She describes her pain as constant and dull, throbbing, aching, sharp, shooting. It is aggravated by using her hand, doing her hair, doing her make-up, writing. After she does these activities, she will notice swelling along her dorsal hand. She currently rates pain severity as a 4/10. She reports associated swelling, pain at night. She denies associated finger locking/catching, numbness or tingling, weakness, fevers or chills, night sweats, weight loss, skin color change. She feels like putting some pressure in the area does initially her feel better.  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    Patient Stated Goals Want my hand better -the pain , swelling and use so  I can use it at home , work    Currently in Pain? Yes    Pain Score 5     Pain Location Hand    Pain Orientation Right    Pain Descriptors / Indicators Tender    Pain Type Acute pain;Chronic pain                [facility] OT Assessment - 01/18/23 0001       Right Hand AROM   R Thumb Radial ABduction/ADduction 0-55 52    R Thumb Palmar ABduction/ADduction 0-45 55    R Thumb Opposition to Index --   opposition to all digits            Pt arrive with increase thumb AROM to WNL - compensate with more MC flexion  than IP with opposition But increase motion - less pain Pt report pain coming in 5/10 mostly between 2nd and 3rd MC into thenar eminence           OT Treatments/Exercises (OP) - 01/18/23 0001       RUE Contrast Bath   Time 8 minutes    Comments prior to AROM and soft tissue            Soft tissue mobs done gentle to webspace of thumb.  With gentle palmar radial abduction. Gentle carpal spreads patient reporting feeling opening palm-add to home program Reviewed with patient about opening the palm opening the carpal tunnel opening the metacarpals as well as thenar eminence, webspace. Patient tight and tender over the volar wrist and forearm. Unable to tolerate any Graston tools -soft tissue myofascial release done on volar forearm and wrist prior to gentle composite wrist and digit extension as well as flexion.  Patient to continue to use thumb spica splint most all the time off with ADLs and home exercises. Continue with a CMC neoprene to use on the computer using the mouse. Patient to do 2-3 times a day contrast prior to pain-free gentle tendon glides and thumb palmar radial abduction 8-10 reps Reviewed with patient again this date to do gentle active range of motion no forceful movement or squeezing digits in a fist. Opposition to all digits but picking up 1 or 2 cm foam block pain-free       OT Education - 01/18/23 2106     Education Details progress and changes to HEP and splint use    Person(s) Educated Patient    Methods Explanation;Demonstration;Tactile cues;Verbal cues;Handout    Comprehension Verbal cues required;Returned demonstration;Verbalized understanding                 OT Long Term Goals - 01/16/23 2038       OT LONG TERM GOAL #1   Title Pt to be ind in HEP to decrease pain/edema iwith AROM of digits, thumb and wrist to less than 2/10    Baseline pain increase to 5/10 with grip , prehension and wrsit ext , thumb AROM in all planes and  flexion/extention of digits- tenderness in webspace and thenar eminence 10/10    Time 4    Period Weeks    Status New    Target Date 02/13/23      OT LONG TERM GOAL #2   Title Pt to show decrease edema and pain to less than 2/10 with bathing, dressing and grooming    Baseline pain and edema increase in R hand after using ADL'ls - increase to 8/10    Time 5  Period Weeks    Status New    Target Date 02/20/23      OT LONG TERM GOAL #3   Title Pain and edema improve in R hand and wrist to wean pt out of splints with ADL's    Baseline Thumb spica fitted to wear most all the time- CMC neoprene with use of computer mouse - pain 5-8/10 pain in dorsal hand and thenar eminence and webspace    Time 6    Period Weeks    Status New    Target Date 02/27/23      OT LONG TERM GOAL #4   Title Pt verbalize 3 modifications to tasks to decrease edema and pain in ADL's and IADL's    Baseline pain 5-8/10 with AROM - increase edema after use and pain - no knowledge of modifcations    Time 6    Period Weeks    Status New    Target Date 02/27/23      OT LONG TERM GOAL #5   Title R grip and prehension strength increase to WNL for her age without increase symptoms to use hand more than 75%    Baseline grip 20 , lat 15 and 3 point 16 - pain 5/10  pain - pain increase to 8/10 tenderenss in thumb webspace -    Time 8    Period Weeks    Status New    Target Date 03/13/23                   Plan - 01/18/23 2107     Clinical Impression Statement Pt present at OT eval -refer with hx of chronic hand pain and swelling since April 23 - when her hand got caught between 2 objects on conveyer belt - EMG on 10/30/22 showed mild carpal tunnel syndrome. MRI earlier this year showed mild multiple finger flexor tenosynovitis. She has had evaluation in multiple offices including MRI of the hand, EMG, x-ray. She has had treatment with naproxen, nortriptyline, tramadol, acetaminophen, brace, physical therapy  (pivot; mostly given home exercise) with ongoing symptoms. Pt show increase tenderness over thenar eminence and between 2nd and 3rd MC- 10/10 at eval. Pain with composite extention and flexion of digits -but pain more with thumb PA and RA and opposition. Pain with prehension and grip strength - 5/10 pain -with increase swelling over thenar eminence and dorsal hand.  Pt arrive this date with pain 5/10 but decrease during session- thumb PA and RA as well as opposition increase compare to Eval - Pt with increase edema ,pain and decrease ROM and strength all limiting her functional use of R dominant hand in ADL's and IADL's. Pt to cont with thumb spica splint to use most all the time. Neoprene CMC for computer use.    OT Occupational Profile and History Comprehensive Assessment- Review of records and extensive additional review of physical, cognitive, psychosocial history related to current functional performance    Occupational performance deficits (Please refer to evaluation for details): ADL's;IADL's;Leisure;Rest and Sleep;Work;Play;Social Participation    Body Structure / Function / Physical Skills ADL;Decreased knowledge of use of DME;Flexibility;ROM;UE functional use;Dexterity;Edema;Strength;Pain;IADL    Rehab Potential Good    Clinical Decision Making Several treatment options, min-mod task modification necessary    Comorbidities Affecting Occupational Performance: May have comorbidities impacting occupational performance    Modification or Assistance to Complete Evaluation  No modification of tasks or assist necessary to complete eval    OT Frequency 2x / week  OT Duration 8 weeks    OT Treatment/Interventions Self-care/ADL training;Cryotherapy;Ultrasound;Iontophoresis;Paraffin;Fluidtherapy;Contrast Bath;DME and/or AE instruction;Manual Therapy;Passive range of motion;Splinting;Patient/family education;Dry needling;Therapeutic exercise    Consulted and Agree with Plan of Care Patient              Patient will benefit from skilled therapeutic intervention in order to improve the following deficits and impairments:   Body Structure / Function / Physical Skills: ADL, Decreased knowledge of use of DME, Flexibility, ROM, UE functional use, Dexterity, Edema, Strength, Pain, IADL       Visit Diagnosis: Pain in right hand  Localized edema  Stiffness of right hand, not elsewhere classified    Problem List Patient Active Problem List   Diagnosis Date Noted   Indication for care in labor and delivery, antepartum 04/29/2018   Preterm uterine contractions in second trimester, antepartum 02/06/2018   Pregnancy 12/29/2017   Hyperemesis arising during pregnancy 10/21/2017    Oletta Cohn, OTR/L,CLT 01/18/2023, 9:11 PM  [facility] [address] [name], [address] Phone: [phone]   Fax:  [phone]  Name: Brenda Fox MRN: [id number] Date of Birth: 1991/02/14

## 2023-01-23 ENCOUNTER — Ambulatory Visit: Payer: Medicaid Other | Admitting: Occupational Therapy

## 2023-01-23 ENCOUNTER — Encounter: Payer: Self-pay | Admitting: Occupational Therapy

## 2023-01-23 DIAGNOSIS — M79641 Pain in right hand: Secondary | ICD-10-CM

## 2023-01-23 DIAGNOSIS — R600 Localized edema: Secondary | ICD-10-CM

## 2023-01-23 DIAGNOSIS — M6281 Muscle weakness (generalized): Secondary | ICD-10-CM

## 2023-01-23 DIAGNOSIS — M25641 Stiffness of right hand, not elsewhere classified: Secondary | ICD-10-CM

## 2023-01-23 NOTE — Therapy (Signed)
[facility] [address], [name], [address] Phone: [phone]   Fax:  [phone]  Occupational Therapy Treatment  Patient Details  Name: Brenda Fox MRN: [id number] Date of Birth: 03/16/1991 Referring Provider (OT): Dr Landry Mellow   Encounter Date: 01/23/2023   OT End of Session - 01/23/23 1138     Visit Number 3    Number of Visits 16    Date for OT Re-Evaluation 03/13/23    OT Start Time 1120    OT Stop Time 1201    OT Time Calculation (min) 41 min    Activity Tolerance Patient tolerated treatment well;Patient limited by pain    Behavior During Therapy [facility] for tasks assessed/performed             Past Medical History:  Diagnosis Date   Cancer of cervix [facility])     Past Surgical History:  Procedure Laterality Date   procedure for cervical cancer      There were no vitals filed for this visit.   Subjective Assessment - 01/23/23 1136     Subjective  I was doing better after last time until Saturday when I did some cooking - took of my spilnt and since then had been hurting again - and swollen    Pertinent History 12/13/22 Ortho note- was seen again 01/05/23 and refer to OT -  /Deslyn Licht is a 32 y.o. female that presents to clinic today for evaluation and management of chronic hand pain and swelling at the referral of Elvin So, PA (neurology).  At the time of this visit, I reviewed her most recent evaluation by neurology for ongoing right hand pain suspected to be due to mild carpal tunnel syndrome seen on EMG from 10/30/2022. She has previously been evaluated by [facility] orthopedics on 09/26/2022 where she was recommended hand therapy, naproxen, and EMG for her MRI findings of mild multiple finger flexor tenosynovitis. She has initial evaluation by a local urgent care on 12/23/2021 after acute onset hand pain and swelling from her hand getting caught conveyor belt at work. She was suspected to have a hand strain  treated with ibuprofen 800 mg.  Her pain began almost 1 year ago when her hand was caught between a conveyor belt and metal. Since that time, she has had evaluation in multiple offices including MRI of the hand, EMG, x-ray. She has had treatment with naproxen, nortriptyline, tramadol, acetaminophen, brace, physical therapy (pivot; mostly given home exercise) with ongoing symptoms. There was question if her pain was coming from her underlying mild carpal tunnel syndrome so she was recommended consultation by orthopedics. The pain is located over her dorsal hand between the second and third metacarpal. Her pain can be deep to this area. She is starting to notice pain radiating up her dorsal forearm toward her elbow. She describes her pain as constant and dull, throbbing, aching, sharp, shooting. It is aggravated by using her hand, doing her hair, doing her make-up, writing. After she does these activities, she will notice swelling along her dorsal hand. She currently rates pain severity as a 4/10. She reports associated swelling, pain at night. She denies associated finger locking/catching, numbness or tingling, weakness, fevers or chills, night sweats, weight loss, skin color change. She feels like putting some pressure in the area does initially her feel better.  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    Patient Stated Goals Want my hand better -the pain , swelling  and use so I can use it at home , work    Currently in Pain? Yes    Pain Score 6     Pain Location Hand    Pain Orientation Right    Pain Descriptors / Indicators Aching;Tightness;Tender    Pain Type Acute pain;Chronic pain    Pain Onset More than a month ago    Pain Frequency Constant                Patient report her hand and forearm felt better after last time session until she overused her hand Saturday and cooking.  Pt arrive with increased pain reported after cooking someone Saturday.   Reports some  increased edema over dorsal second and third metacarpal and thenar eminence  Patient done extended lateral pinch and 3-point pinch doing scrambled eggs  Reviewed with patient modifications with cooking and gripping objects at home and cooking and cleaning.    Pt report pain coming in 5-6/10 mostly between 2nd and 3rd MC into thenar eminence                           OT Treatments/Exercises (OP) - 01/23/23 0001       Ultrasound   Ultrasound Location radial hand volar and dorsal    Ultrasound Parameters 3.54mhz, 0.8 , 20 %    Ultrasound Goals Edema;Pain      RUE Contrast Bath   Time 8 minutes    Comments prior to soft tissue and ROM - decrease pain and edema             Soft tissue mobs done gentle to webspace of thumb.  With gentle palmar radial abduction. Gentle carpal spreads patient reporting feeling opening palm-add to home program Reviewed with patient about opening the palm opening the carpal tunnel opening the metacarpals as well as thenar eminence, webspace. Patient tight and tender over the volar wrist and forearm. Patient did try some massaging over the hand and forearm over the weekend -was able to tolerate today Graston tools and massage stone-soft tissue myofascial release done on volar forearm and wrist prior to gentle composite wrist and digit extension as well as flexion. Compare to L hand and forearm   Patient to continue to use thumb spica splint most all the time off with ADLs and home exercises. Continue with a CMC neoprene to use on the computer using the mouse. Patient fitted with Isotoner glove to use under her thumb spica as well as at nighttime.  Patient to do 2-3 times a day contrast prior to pain-free gentle tendon glides and thumb palmar radial abduction 8-10 reps Reviewed with patient again this date to do gentle active range of motion no forceful movement or squeezing digits in a fist. Opposition to all digits but picking up 1 or 2 cm  foam block pain-free        OT Education - 01/23/23 1138     Education Details progress and changes to HEP and splint use    Person(s) Educated Patient    Methods Explanation;Demonstration;Tactile cues;Verbal cues;Handout    Comprehension Verbal cues required;Returned demonstration;Verbalized understanding                 OT Long Term Goals - 01/16/23 2038       OT LONG TERM GOAL #1   Title Pt to be ind in HEP to decrease pain/edema iwith AROM of digits, thumb and wrist to less than 2/10  Baseline pain increase to 5/10 with grip , prehension and wrsit ext , thumb AROM in all planes and flexion/extention of digits- tenderness in webspace and thenar eminence 10/10    Time 4    Period Weeks    Status New    Target Date 02/13/23      OT LONG TERM GOAL #2   Title Pt to show decrease edema and pain to less than 2/10 with bathing, dressing and grooming    Baseline pain and edema increase in R hand after using ADL'ls - increase to 8/10    Time 5    Period Weeks    Status New    Target Date 02/20/23      OT LONG TERM GOAL #3   Title Pain and edema improve in R hand and wrist to wean pt out of splints with ADL's    Baseline Thumb spica fitted to wear most all the time- CMC neoprene with use of computer mouse - pain 5-8/10 pain in dorsal hand and thenar eminence and webspace    Time 6    Period Weeks    Status New    Target Date 02/27/23      OT LONG TERM GOAL #4   Title Pt verbalize 3 modifications to tasks to decrease edema and pain in ADL's and IADL's    Baseline pain 5-8/10 with AROM - increase edema after use and pain - no knowledge of modifcations    Time 6    Period Weeks    Status New    Target Date 02/27/23      OT LONG TERM GOAL #5   Title R grip and prehension strength increase to WNL for her age without increase symptoms to use hand more than 75%    Baseline grip 20 , lat 15 and 3 point 16 - pain 5/10  pain - pain increase to 8/10 tenderenss in thumb  webspace -    Time 8    Period Weeks    Status New    Target Date 03/13/23                   Plan - 01/23/23 1139     Clinical Impression Statement Pt present at OT eval -refer with hx of chronic hand pain and swelling since April 23 - when her hand got caught between 2 objects on conveyer belt - EMG on 10/30/22 showed mild carpal tunnel syndrome. MRI earlier this year showed mild multiple finger flexor tenosynovitis. She has had evaluation in multiple offices including MRI of the hand, EMG, x-ray. She has had treatment with naproxen, nortriptyline, tramadol, acetaminophen, brace, physical therapy (pivot; mostly given home exercise) with ongoing symptoms. Pt show increase tenderness over thenar eminence and between 2nd and 3rd MC- 10/10 at eval. Pain with composite extention and flexion of digits -but pain more with thumb  RA and opposition. Pain with prehension and grip strength - 5/10 pain -with increase swelling over thenar eminence and dorsal hand.  Pt arrive with increase pain today after cooking this weekend - prior to that improved per pt - pain decrease during session- thumb PA and RA as well as opposition increase compare to Eval - Pt able to tolerate more soft tissue this date on volar hand and forearm. Reviewed with pt modification to tasks to not increase symptoms.  Pt with increase edema ,pain and decrease ROM and strength all limiting her functional use of R dominant hand in ADL's and IADL's. Pt  to cont with thumb spica splint to use most all the time. Neoprene CMC for computer use.    OT Occupational Profile and History Comprehensive Assessment- Review of records and extensive additional review of physical, cognitive, psychosocial history related to current functional performance    Occupational performance deficits (Please refer to evaluation for details): ADL's;IADL's;Leisure;Rest and Sleep;Work;Play;Social Participation    Body Structure / Function / Physical Skills  ADL;Decreased knowledge of use of DME;Flexibility;ROM;UE functional use;Dexterity;Edema;Strength;Pain;IADL    Rehab Potential Good    Clinical Decision Making Several treatment options, min-mod task modification necessary    Comorbidities Affecting Occupational Performance: May have comorbidities impacting occupational performance    Modification or Assistance to Complete Evaluation  No modification of tasks or assist necessary to complete eval    OT Frequency 2x / week    OT Duration 8 weeks    OT Treatment/Interventions Self-care/ADL training;Cryotherapy;Ultrasound;Iontophoresis;Paraffin;Fluidtherapy;Contrast Bath;DME and/or AE instruction;Manual Therapy;Passive range of motion;Splinting;Patient/family education;Dry needling;Therapeutic exercise    Consulted and Agree with Plan of Care Patient             Patient will benefit from skilled therapeutic intervention in order to improve the following deficits and impairments:   Body Structure / Function / Physical Skills: ADL, Decreased knowledge of use of DME, Flexibility, ROM, UE functional use, Dexterity, Edema, Strength, Pain, IADL       Visit Diagnosis: Pain in right hand  Localized edema  Stiffness of right hand, not elsewhere classified  Muscle weakness (generalized)    Problem List Patient Active Problem List   Diagnosis Date Noted   Indication for care in labor and delivery, antepartum 04/29/2018   Preterm uterine contractions in second trimester, antepartum 02/06/2018   Pregnancy 12/29/2017   Hyperemesis arising during pregnancy 10/21/2017    Oletta Cohn, OTR/L,CLT 01/23/2023, 2:31 PM  [place]  [facility] [address] [name], [address] Phone: [phone]   Fax:  [phone]  Name: Brenda Fox MRN: [id number] Date of Birth: November 27, 1990

## 2023-01-25 ENCOUNTER — Ambulatory Visit: Payer: Medicaid Other | Admitting: Occupational Therapy

## 2023-01-25 DIAGNOSIS — M79641 Pain in right hand: Secondary | ICD-10-CM

## 2023-01-25 DIAGNOSIS — R600 Localized edema: Secondary | ICD-10-CM

## 2023-01-25 DIAGNOSIS — M6281 Muscle weakness (generalized): Secondary | ICD-10-CM

## 2023-01-25 DIAGNOSIS — M25641 Stiffness of right hand, not elsewhere classified: Secondary | ICD-10-CM

## 2023-01-25 NOTE — Therapy (Signed)
[facility] [address], [name], [address] Phone: [phone]   Fax:  [phone]  Occupational Therapy Treatment  Patient Details  Name: Brenda Fox MRN: [id number] Date of Birth: 04-20-1991 Referring Provider (OT): Dr Landry Mellow   Encounter Date: 01/25/2023   OT End of Session - 01/25/23 0948     Visit Number 4    Number of Visits 16    Date for OT Re-Evaluation 03/13/23    OT Start Time 0948    OT Stop Time 1030    OT Time Calculation (min) 42 min    Activity Tolerance Patient tolerated treatment well;Patient limited by pain    Behavior During Therapy [facility] for tasks assessed/performed             Past Medical History:  Diagnosis Date   Cancer of cervix [facility])     Past Surgical History:  Procedure Laterality Date   procedure for cervical cancer      There were no vitals filed for this visit.   Subjective Assessment - 01/25/23 0948     Subjective  Feeling better since last time - getting better I can tell - my daughter doing the massage and I am not using my hand to cook like last weekend    Pertinent History 12/13/22 Ortho note- was seen again 01/05/23 and refer to OT -  /Brenda Fox is a 32 y.o. female that presents to clinic today for evaluation and management of chronic hand pain and swelling at the referral of Elvin So, PA (neurology).  At the time of this visit, I reviewed her most recent evaluation by neurology for ongoing right hand pain suspected to be due to mild carpal tunnel syndrome seen on EMG from 10/30/2022. She has previously been evaluated by [facility] orthopedics on 09/26/2022 where she was recommended hand therapy, naproxen, and EMG for her MRI findings of mild multiple finger flexor tenosynovitis. She has initial evaluation by a local urgent care on 12/23/2021 after acute onset hand pain and swelling from her hand getting caught conveyor belt at work. She was suspected to have a hand strain treated  with ibuprofen 800 mg.  Her pain began almost 1 year ago when her hand was caught between a conveyor belt and metal. Since that time, she has had evaluation in multiple offices including MRI of the hand, EMG, x-ray. She has had treatment with naproxen, nortriptyline, tramadol, acetaminophen, brace, physical therapy (pivot; mostly given home exercise) with ongoing symptoms. There was question if her pain was coming from her underlying mild carpal tunnel syndrome so she was recommended consultation by orthopedics. The pain is located over her dorsal hand between the second and third metacarpal. Her pain can be deep to this area. She is starting to notice pain radiating up her dorsal forearm toward her elbow. She describes her pain as constant and dull, throbbing, aching, sharp, shooting. It is aggravated by using her hand, doing her hair, doing her make-up, writing. After she does these activities, she will notice swelling along her dorsal hand. She currently rates pain severity as a 4/10. She reports associated swelling, pain at night. She denies associated finger locking/catching, numbness or tingling, weakness, fevers or chills, night sweats, weight loss, skin color change. She feels like putting some pressure in the area does initially her feel better.  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    Patient Stated Goals Want my hand better -the pain , swelling  and use so I can use it at home , work    Currently in Pain? Yes    Pain Score 3     Pain Location --   hand   Pain Orientation Right    Pain Descriptors / Indicators Aching;Tightness    Pain Type Acute pain;Chronic pain    Pain Onset More than a month ago    Pain Frequency Constant                [facility] OT Assessment - 01/25/23 0001       Right Hand AROM   R Thumb Radial ABduction/ADduction 0-55 55    R Thumb Palmar ABduction/ADduction 0-45 55    R Thumb Opposition to Index --   opposition to 5th - slight pull-                      OT Treatments/Exercises (OP) - 01/25/23 0001       Ultrasound   Ultrasound Location volar palm and webspace    Ultrasound Parameters 3.16mhz, 0.8 and 20%    Ultrasound Goals Edema;Pain      RUE Contrast Bath   Time 8 minutes    Comments prior to soft tissue adn ROM              Soft tissue mobs done gentle to webspace of thumb.  With gentle palmar radial abduction. Gentle carpal spreads patient reporting feeling opening palm - daughter helping her Reviewed with patient about opening the palm opening the carpal tunnel opening the metacarpals as well as thenar eminence, webspace. Patient tightness and tenderness improved greatly over the volar wrist and forearm. Graston tools and massage stone-soft tissue myofascial release done on volar forearm and wrist prior to gentle composite wrist and digit extension as well as flexion. Compare to L hand and forearm   Patient to continue to use thumb spica splint most all the time -off with ADLs and home exercises. Continue with a CMC neoprene to use on the computer using the mouse if it keep pain decrease Patient fitted with Isotoner glove to use under her thumb spica as well as at nighttime.   Patient to do 2-3 times a day contrast prior to pain-free gentle tendon glides and thumb palmar radial abduction 8-10 reps Reviewed with patient again this date to do gentle active range of motion no forceful movement or squeezing digits in a fist. Opposition to all digits - focus on oval and IP/MC flexion         OT Education - 01/25/23 0948     Education Details progress and changes to HEP and splint use    Person(s) Educated Patient    Methods Explanation;Demonstration;Tactile cues;Verbal cues;Handout    Comprehension Verbal cues required;Returned demonstration;Verbalized understanding                 OT Long Term Goals - 01/16/23 2038       OT LONG TERM GOAL #1   Title Pt to be ind in HEP to  decrease pain/edema iwith AROM of digits, thumb and wrist to less than 2/10    Baseline pain increase to 5/10 with grip , prehension and wrsit ext , thumb AROM in all planes and flexion/extention of digits- tenderness in webspace and thenar eminence 10/10    Time 4    Period Weeks    Status New    Target Date 02/13/23      OT LONG TERM GOAL #2  Title Pt to show decrease edema and pain to less than 2/10 with bathing, dressing and grooming    Baseline pain and edema increase in R hand after using ADL'ls - increase to 8/10    Time 5    Period Weeks    Status New    Target Date 02/20/23      OT LONG TERM GOAL #3   Title Pain and edema improve in R hand and wrist to wean pt out of splints with ADL's    Baseline Thumb spica fitted to wear most all the time- [facility] neoprene with use of computer mouse - pain 5-8/10 pain in dorsal hand and thenar eminence and webspace    Time 6    Period Weeks    Status New    Target Date 02/27/23      OT LONG TERM GOAL #4   Title Pt verbalize 3 modifications to tasks to decrease edema and pain in ADL's and IADL's    Baseline pain 5-8/10 with AROM - increase edema after use and pain - no knowledge of modifcations    Time 6    Period Weeks    Status New    Target Date 02/27/23      OT LONG TERM GOAL #5   Title R grip and prehension strength increase to WNL for her age without increase symptoms to use hand more than 75%    Baseline grip 20 , lat 15 and 3 point 16 - pain 5/10  pain - pain increase to 8/10 tenderenss in thumb webspace -    Time 8    Period Weeks    Status New    Target Date 03/13/23                   Plan - 01/25/23 0949     Clinical Impression Statement Pt present at OT eval -refer with hx of chronic hand pain and swelling since April 23 - when her hand got caught between 2 objects on conveyer belt - EMG on 10/30/22 showed mild carpal tunnel syndrome. MRI earlier this year showed mild multiple finger flexor tenosynovitis. She has  had evaluation in multiple offices including MRI of the hand, EMG, x-ray. She has had treatment with naproxen, nortriptyline, tramadol, acetaminophen, brace, physical therapy (pivot; mostly given home exercise) with ongoing symptoms. Pt show increase tenderness over thenar eminence and between 2nd and 3rd MC- 10/10 at eval. Pain with composite extention and flexion of digits -but pain more with thumb  RA and opposition. Pain with prehension and grip strength - 5/10 pain -with increase swelling over thenar eminence and dorsal hand.  Pt pain improving and soft tissue - using isotoner glove and thumb spica most of the time. Show increase PA and RA as well as opposition increase compare to Eval - Pt able to tolerate more soft tissue this date on volar hand and forearm. Reviewed with pt modification to tasks to not increase symptoms.  Pt with increase edema ,pain and decrease ROM and strength all limiting her functional use of R dominant hand in ADL's and IADL's. Pt to cont with thumb spica splint to use most all the time. Neoprene CMC for computer use.    OT Occupational Profile and History Comprehensive Assessment- Review of records and extensive additional review of physical, cognitive, psychosocial history related to current functional performance    Occupational performance deficits (Please refer to evaluation for details): ADL's;IADL's;Leisure;Rest and Sleep;Work;Play;Social Participation    Body Structure /  Function / Physical Skills ADL;Decreased knowledge of use of DME;Flexibility;ROM;UE functional use;Dexterity;Edema;Strength;Pain;IADL    Rehab Potential Good    Clinical Decision Making Several treatment options, min-mod task modification necessary    Comorbidities Affecting Occupational Performance: May have comorbidities impacting occupational performance    Modification or Assistance to Complete Evaluation  No modification of tasks or assist necessary to complete eval    OT Frequency 2x / week    OT  Duration 8 weeks    OT Treatment/Interventions Self-care/ADL training;Cryotherapy;Ultrasound;Iontophoresis;Paraffin;Fluidtherapy;Contrast Bath;DME and/or AE instruction;Manual Therapy;Passive range of motion;Splinting;Patient/family education;Dry needling;Therapeutic exercise    Consulted and Agree with Plan of Care Patient             Patient will benefit from skilled therapeutic intervention in order to improve the following deficits and impairments:   Body Structure / Function / Physical Skills: ADL, Decreased knowledge of use of DME, Flexibility, ROM, UE functional use, Dexterity, Edema, Strength, Pain, IADL       Visit Diagnosis: Pain in right hand  Stiffness of right hand, not elsewhere classified  Muscle weakness (generalized)  Localized edema    Problem List Patient Active Problem List   Diagnosis Date Noted   Indication for care in labor and delivery, antepartum 04/29/2018   Preterm uterine contractions in second trimester, antepartum 02/06/2018   Pregnancy 12/29/2017   Hyperemesis arising during pregnancy 10/21/2017    Oletta Cohn, OTR/L,CLT 01/25/2023, 12:57 PM  [facility] [address] [name], [address] Phone: [phone]   Fax:  [phone]  Name: Brenda Fox MRN: [id number] Date of Birth: 1991-04-07

## 2023-02-26 ENCOUNTER — Ambulatory Visit: Payer: Medicaid Other | Attending: Sports Medicine | Admitting: Occupational Therapy

## 2023-02-26 DIAGNOSIS — M79641 Pain in right hand: Secondary | ICD-10-CM | POA: Diagnosis present

## 2023-02-26 DIAGNOSIS — M25641 Stiffness of right hand, not elsewhere classified: Secondary | ICD-10-CM | POA: Diagnosis present

## 2023-02-26 DIAGNOSIS — M6281 Muscle weakness (generalized): Secondary | ICD-10-CM | POA: Insufficient documentation

## 2023-02-26 DIAGNOSIS — R600 Localized edema: Secondary | ICD-10-CM | POA: Diagnosis present

## 2023-02-26 NOTE — Therapy (Signed)
[facility] [address] [name], [address] Phone: [phone]   Fax:  [phone]  Occupational Therapy Treatment  Patient Details  Name: Brenda Fox MRN: [id number] Date of Birth: 1991/04/25 Referring Provider (OT): Dr Landry Mellow   Encounter Date: 02/26/2023   OT End of Session - 02/26/23 8416     Visit Number 5    Number of Visits 16    Date for OT Re-Evaluation 03/13/23    OT Start Time 0822    OT Stop Time 0906    OT Time Calculation (min) 44 min    Activity Tolerance Patient tolerated treatment well;Patient limited by pain    Behavior During Therapy [facility] for tasks assessed/performed             Past Medical History:  Diagnosis Date   Cancer of cervix [facility])     Past Surgical History:  Procedure Laterality Date   procedure for cervical cancer      There were no vitals filed for this visit.   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 female that presents to clinic today for evaluation and management of chronic hand pain and swelling at the referral of Brenda So, PA (neurology).  At the time of this visit, I reviewed her most recent evaluation by neurology for ongoing right hand pain suspected to be due to mild carpal tunnel syndrome seen on EMG from 10/30/2022. She has previously been evaluated by [facility] orthopedics on 09/26/2022 where she was recommended hand therapy, naproxen, and EMG for her MRI findings of mild multiple finger flexor  tenosynovitis. She has initial evaluation by a local urgent care on 12/23/2021 after acute onset hand pain and swelling from her hand getting caught conveyor belt at work. She was suspected to have a hand strain treated with ibuprofen 800 mg.  Her pain began almost 1 year ago when her hand was caught between a conveyor belt and metal. Since that time, she has had evaluation in multiple offices including MRI of the hand, EMG, x-ray. She has had treatment with naproxen, nortriptyline, tramadol, acetaminophen, brace, physical therapy (pivot; mostly given home exercise) with ongoing symptoms. There was question if her pain was coming from her underlying mild carpal tunnel syndrome Fox she was recommended consultation by orthopedics. The pain is located over her dorsal hand between the second and third metacarpal. Her pain can be deep to this area. She is starting to notice pain radiating up her dorsal forearm toward her elbow. She describes her pain as constant and dull, throbbing, aching, sharp, shooting. It is aggravated by using her hand, doing her hair, doing her make-up, writing. After she does these activities, she will notice swelling along her dorsal hand. She currently rates pain severity as a 4/10. She reports associated swelling, pain at night. She denies associated finger locking/catching, numbness or tingling, weakness, fevers or chills, night  sweats, weight loss, skin color change. She feels like putting some pressure in the area does initially her feel better.  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    Patient Stated Goals Want my hand better -the pain , swelling and use Fox I can use it at home , work    Currently in Pain? Yes    Pain Score 6     Pain Location Hand    Pain Orientation Right    Pain Descriptors / Indicators Aching;Tightness    Pain Type Acute pain    Pain Onset More than a month ago                [facility] OT Assessment - 02/26/23 0001       AROM   Right  Wrist Extension 62 Degrees   pull   Right Wrist Flexion 84 Degrees    Right Wrist Radial Deviation 20 Degrees    Right Wrist Ulnar Deviation 30 Degrees      Strength   Right Hand Grip (lbs) 20    Right Hand Lateral Pinch 15 lbs    Right Hand 3 Point Pinch 9 lbs      Right Hand AROM   R Thumb Radial ABduction/ADduction 0-55 43   6/10 pull webspace   R Thumb Palmar ABduction/ADduction 0-45 55   webspace  6/10   R Thumb Opposition to Index --   opposition all digits - 6/10 to 4th and 5th            Patient arrived after not being seen for about a month.  Patient was getting trained at work and being out of town for a month. Patient reported pain was decreased to 1-2/10 But then it increased the last 3 to 4 days to 6/10. Patient reports she lost her CMC neoprene about 2 weeks ago wearing thumb spica mostly          OT Treatments/Exercises (OP) - 02/26/23 0001       RUE Fluidotherapy   Number Minutes Fluidotherapy 8 Minutes    RUE Fluidotherapy Location Hand;Wrist    Comments prior to soft tissue nad ice 2 x 1 min            Soft tissue mobs done gentle to webspace of thumb.  With gentle palmar/ radial abduction. Gentle carpal spreads patient reporting feeling opening palm - daughter helping her Reviewed with patient about opening the palm opening the carpal tunnel opening the metacarpals as well as thenar eminence, webspace. Patient tightness and tenderness improved greatly over the volar wrist and forearm- Graston tools and massage stone-soft tissue myofascial release done on volar forearm and wrist prior to gentle composite wrist and digit extension as well as flexion. Reviewed and done medial nerve glides 5 reps hold 5 seconds adding light stretch to thumb radial abduction Prior to active range of motion for thumb palmar radial abduction Patient with less pain to-3/10 after Feudo and soft tissue Opposition to digits pain-free second and third. Slight pull to  fourth and fifth. Patient to hold off and continue with opposition only to pain-free second and third. Fitted with a new CMC neoprene to use an alternate with thumb spica.   Patient to continue to use thumb spica splint most all the time -off with ADLs and home exercises. Continue with a CMC neoprene to use on the computer using the mouse if it keep pain decrease Patient fitted with Isotoner glove to use under her  thumb spica as well as at nighttime.   Patient to do 2-3 times a day contrast prior to pain-free gentle tendon glides and thumb palmar radial abduction 8-10 reps Reviewed with patient again this date to do gentle active range of motion no forceful movement or squeezing digits in a fist. Opposition to all digits - focus on oval and IP/MC flexion           OT Education - 02/26/23 0822     Education Details progress and changes to HEP and splint use    Person(s) Educated Patient    Methods Explanation;Demonstration;Tactile cues;Verbal cues;Handout    Comprehension Verbal cues required;Returned demonstration;Verbalized understanding                 OT Long Term Goals - 01/16/23 2038       OT LONG TERM GOAL #1   Title Pt to be ind in HEP to decrease pain/edema iwith AROM of digits, thumb and wrist to less than 2/10    Baseline pain increase to 5/10 with grip , prehension and wrsit ext , thumb AROM in all planes and flexion/extention of digits- tenderness in webspace and thenar eminence 10/10    Time 4    Period Weeks    Status New    Target Date 02/13/23      OT LONG TERM GOAL #2   Title Pt to show decrease edema and pain to less than 2/10 with bathing, dressing and grooming    Baseline pain and edema increase in R hand after using ADL'ls - increase to 8/10    Time 5    Period Weeks    Status New    Target Date 02/20/23      OT LONG TERM GOAL #3   Title Pain and edema improve in R hand and wrist to wean pt out of splints with ADL's    Baseline Thumb spica  fitted to wear most all the time- CMC neoprene with use of computer mouse - pain 5-8/10 pain in dorsal hand and thenar eminence and webspace    Time 6    Period Weeks    Status New    Target Date 02/27/23      OT LONG TERM GOAL #4   Title Pt verbalize 3 modifications to tasks to decrease edema and pain in ADL's and IADL's    Baseline pain 5-8/10 with AROM - increase edema after use and pain - no knowledge of modifcations    Time 6    Period Weeks    Status New    Target Date 02/27/23      OT LONG TERM GOAL #5   Title R grip and prehension strength increase to WNL for her age without increase symptoms to use hand more than 75%    Baseline grip 20 , lat 15 and 3 point 16 - pain 5/10  pain - pain increase to 8/10 tenderenss in thumb webspace -    Time 8    Period Weeks    Status New    Target Date 03/13/23                   Plan - 02/26/23 4098     Clinical Impression Statement Pt present at OT eval -refer with hx of chronic hand pain and swelling since April 23 - when her hand got caught between 2 objects on conveyer belt - EMG on 10/30/22 showed mild carpal tunnel syndrome. MRI earlier this year showed mild  multiple finger flexor tenosynovitis. She has had evaluation in multiple offices including MRI of the hand, EMG, x-ray. She has had treatment with naproxen, nortriptyline, tramadol, acetaminophen, brace, physical therapy (pivot; mostly given home exercise) with ongoing symptoms. Pt show increase tenderness over thenar eminence,  and between 2nd and 3rd MC- 10/10 at eval.  Pt was making progress about month ago but had to go for work [place] for month - return today with reports of pain was doing good to 1-2/10. But lost her soft splint about 2 wks ago and wearing harder splint 80% of time- pain increase 6/10 the last few days.  Pain this date on radial side of hand - webspace and thenar eminence- pain with thumb  RA and opposition. Pain with prehension and grip strength - 6/10 pain  -with increase swelling over thenar eminence and  radial dorsal hand.  In session pain improved - fitted with new CMC neoprene splint to use most of time- fighting her harder one and with some stiffness-  Pt pain improving and soft tissue - using isotoner glove and thumb spica most of the time. Show increase PA and RA as well as opposition increase compare to Eval - Pt able to tolerate more soft tissue this date on volar hand and forearm. Reviewed with pt modification to tasks to not increase symptoms.  Pt with increase edema ,pain and decrease ROM and strength all limiting her functional use of R dominant hand in ADL's and IADL's. Pt to cont with thumb spica splint to use most all the time. Neoprene CMC for computer use.    OT Occupational Profile and History Comprehensive Assessment- Review of records and extensive additional review of physical, cognitive, psychosocial history related to current functional performance    Occupational performance deficits (Please refer to evaluation for details): ADL's;IADL's;Leisure;Rest and Sleep;Work;Play;Social Participation    Body Structure / Function / Physical Skills ADL;Decreased knowledge of use of DME;Flexibility;ROM;UE functional use;Dexterity;Edema;Strength;Pain;IADL    Rehab Potential Good    Clinical Decision Making Several treatment options, min-mod task modification necessary    Comorbidities Affecting Occupational Performance: May have comorbidities impacting occupational performance    Modification or Assistance to Complete Evaluation  No modification of tasks or assist necessary to complete eval    OT Frequency 2x / week    OT Duration 8 weeks    OT Treatment/Interventions Self-care/ADL training;Cryotherapy;Ultrasound;Iontophoresis;Paraffin;Fluidtherapy;Contrast Bath;DME and/or AE instruction;Manual Therapy;Passive range of motion;Splinting;Patient/family education;Dry needling;Therapeutic exercise    Consulted and Agree with Plan of Care Patient              Patient will benefit from skilled therapeutic intervention in order to improve the following deficits and impairments:   Body Structure / Function / Physical Skills: ADL, Decreased knowledge of use of DME, Flexibility, ROM, UE functional use, Dexterity, Edema, Strength, Pain, IADL       Visit Diagnosis: Pain in right hand  Stiffness of right hand, not elsewhere classified  Muscle weakness (generalized)  Localized edema    Problem List Patient Active Problem List   Diagnosis Date Noted   Indication for care in labor and delivery, antepartum 04/29/2018   Preterm uterine contractions in second trimester, antepartum 02/06/2018   Pregnancy 12/29/2017   Hyperemesis arising during pregnancy 10/21/2017    Oletta Cohn, OTR/L,CLT 02/26/2023, 10:35 AM  [facility] [address] [name], [address] Phone: [phone]   Fax:  [phone]  Name: Ama Mcmaster MRN: [id number] Date of Birth: October 14, 1990

## 2023-03-01 ENCOUNTER — Ambulatory Visit: Payer: Medicaid Other | Admitting: Occupational Therapy

## 2023-03-01 DIAGNOSIS — M79641 Pain in right hand: Secondary | ICD-10-CM | POA: Diagnosis not present

## 2023-03-01 DIAGNOSIS — M25641 Stiffness of right hand, not elsewhere classified: Secondary | ICD-10-CM

## 2023-03-01 DIAGNOSIS — R600 Localized edema: Secondary | ICD-10-CM

## 2023-03-01 NOTE — Therapy (Signed)
[facility] [address], [name], [address] Phone: [phone]   Fax:  [phone]  Occupational Therapy Treatment  Patient Details  Name: Brenda Fox MRN: [id number] Date of Birth: July 21, 1991 Referring Provider (OT): Dr Landry Mellow   Encounter Date: 03/01/2023   OT End of Session - 03/01/23 0918     Visit Number 6    Number of Visits 16    Date for OT Re-Evaluation 03/13/23    OT Start Time 0903    OT Stop Time 0945    OT Time Calculation (min) 42 min    Activity Tolerance Patient tolerated treatment well;Patient limited by pain    Behavior During Therapy [facility] for tasks assessed/performed             Past Medical History:  Diagnosis Date   Cancer of cervix [facility])     Past Surgical History:  Procedure Laterality Date   procedure for cervical cancer      There were no vitals filed for this visit.   Subjective Assessment - 03/01/23 0916     Subjective  Hand feels better than last time- 2/10 but feels swollen little - wearing during day soft splint -night time hard one and glove-    Pertinent History 12/13/22 Ortho note- was seen again 01/05/23 and refer to OT -  /Brenda Fox is a 32 y.o. female that presents to clinic today for evaluation and management of chronic hand pain and swelling at the referral of Elvin So, PA (neurology).  At the time of this visit, I reviewed her most recent evaluation by neurology for ongoing right hand pain suspected to be due to mild carpal tunnel syndrome seen on EMG from 10/30/2022. She has previously been evaluated by [facility] orthopedics on 09/26/2022 where she was recommended hand therapy, naproxen, and EMG for her MRI findings of mild multiple finger flexor tenosynovitis. She has initial evaluation by a local urgent care on 12/23/2021 after acute onset hand pain and swelling from her hand getting caught conveyor belt at work. She was suspected to have a hand strain treated with ibuprofen  800 mg.  Her pain began almost 1 year ago when her hand was caught between a conveyor belt and metal. Since that time, she has had evaluation in multiple offices including MRI of the hand, EMG, x-ray. She has had treatment with naproxen, nortriptyline, tramadol, acetaminophen, brace, physical therapy (pivot; mostly given home exercise) with ongoing symptoms. There was question if her pain was coming from her underlying mild carpal tunnel syndrome so she was recommended consultation by orthopedics. The pain is located over her dorsal hand between the second and third metacarpal. Her pain can be deep to this area. She is starting to notice pain radiating up her dorsal forearm toward her elbow. She describes her pain as constant and dull, throbbing, aching, sharp, shooting. It is aggravated by using her hand, doing her hair, doing her make-up, writing. After she does these activities, she will notice swelling along her dorsal hand. She currently rates pain severity as a 4/10. She reports associated swelling, pain at night. She denies associated finger locking/catching, numbness or tingling, weakness, fevers or chills, night sweats, weight loss, skin color change. She feels like putting some pressure in the area does initially her feel better.  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    Patient Stated Goals Want my hand better -the pain , swelling and use so I can use  it at home , work    Currently in Pain? Yes    Pain Score 2     Pain Location Hand    Pain Orientation Right    Pain Descriptors / Indicators Aching;Tightness    Pain Type Acute pain    Pain Onset More than a month ago               Patient arrive with reports of pain and edema decreased to 2/10.  Mostly over the radial hand and thenar eminence   Patient wearing CMC neoprene most of the time during the day and nighttime thumb spica with glove  Active range of motion for palmar radial abduction increased as well as  opposition increased with less pain.          OT Treatments/Exercises (OP) - 03/01/23 0001       RUE Fluidotherapy   Number Minutes Fluidotherapy 8 Minutes    RUE Fluidotherapy Location Hand;Wrist    Comments prior to soft tissue , ROM             Soft tissue mobs done gentle to webspace of thumb.  With gentle palmar/ radial abduction. Gentle carpal spreads patient reporting feeling opening palm - daughter helping her Reviewed with patient about opening the palm opening the carpal tunnel opening the metacarpals as well as thenar eminence, webspace. Patient tightness and tenderness improved greatly over the volar wrist and forearm- Graston tools and massage stone-soft tissue myofascial release done on volar forearm and wrist prior to gentle composite wrist and digit extension as well as flexion. Add this date for pt prayer stretch for wrist extention 10 reps  Hold 5 sec Cont medial nerve glides 5 reps hold 5 seconds adding light stretch to thumb radial abduction active range of motion for thumb palmar radial abduction Followed by rubber band for palmar radial abduction 2 sets of 12 was able to do pain-free today. Add to home exercise  Opposition to digits pain-free second through fourth. Slight pull to fifth. Patient to hold off and continue with opposition only to pain-free          OT Education - 03/01/23 0917     Education Details progress and changes to HEP and splint use    Person(s) Educated Patient    Methods Explanation;Demonstration;Tactile cues;Verbal cues;Handout    Comprehension Verbal cues required;Returned demonstration;Verbalized understanding                 OT Long Term Goals - 01/16/23 2038       OT LONG TERM GOAL #1   Title Pt to be ind in HEP to decrease pain/edema iwith AROM of digits, thumb and wrist to less than 2/10    Baseline pain increase to 5/10 with grip , prehension and wrsit ext , thumb AROM in all planes and  flexion/extention of digits- tenderness in webspace and thenar eminence 10/10    Time 4    Period Weeks    Status New    Target Date 02/13/23      OT LONG TERM GOAL #2   Title Pt to show decrease edema and pain to less than 2/10 with bathing, dressing and grooming    Baseline pain and edema increase in R hand after using ADL'ls - increase to 8/10    Time 5    Period Weeks    Status New    Target Date 02/20/23      OT LONG TERM GOAL #3   Title  Pain and edema improve in R hand and wrist to wean pt out of splints with ADL's    Baseline Thumb spica fitted to wear most all the time- [facility] neoprene with use of computer mouse - pain 5-8/10 pain in dorsal hand and thenar eminence and webspace    Time 6    Period Weeks    Status New    Target Date 02/27/23      OT LONG TERM GOAL #4   Title Pt verbalize 3 modifications to tasks to decrease edema and pain in ADL's and IADL's    Baseline pain 5-8/10 with AROM - increase edema after use and pain - no knowledge of modifcations    Time 6    Period Weeks    Status New    Target Date 02/27/23      OT LONG TERM GOAL #5   Title R grip and prehension strength increase to WNL for her age without increase symptoms to use hand more than 75%    Baseline grip 20 , lat 15 and 3 point 16 - pain 5/10  pain - pain increase to 8/10 tenderenss in thumb webspace -    Time 8    Period Weeks    Status New    Target Date 03/13/23                   Plan - 03/01/23 6045     Clinical Impression Statement Pt present at OT eval -refer with hx of chronic hand pain and swelling since April 23 - when her hand got caught between 2 objects on conveyer belt - EMG on 10/30/22 showed mild carpal tunnel syndrome. MRI earlier this year showed mild multiple finger flexor tenosynovitis. She has had evaluation in multiple offices including MRI of the hand, EMG, x-ray. She has had treatment with naproxen, nortriptyline, tramadol, acetaminophen, brace, physical therapy  (pivot; mostly given home exercise) with ongoing symptoms. Pt show increase tenderness over thenar eminence,  and between 2nd and 3rd MC- 10/10 at eval.  Pt was making progress about month ago but had to go for work trng for month - return 2 days ago with pain increased again to 6/10 - but this date with 2/10 pain on radial hand with thumb ADD and flexors the worse. Cont to have tenderness and edema  webspace and thenar eminence- pain with thumb opposition more than PA and RA. Pt to cont with CMC neoprene splint to use most of time and thumb spica  and glove night time. Was able to add rubber band for PA and RA pain free and composite extention stretch. Pt able to tolerate more soft tissue this date on volar hand and forearm. Reviewed with pt modification to tasks to not increase symptoms.  Pt with increase edema ,pain and decrease ROM and strength all limiting her functional use of R dominant hand in ADL's and IADL's. Pt to cont with thumb spica splint to use most all the time. Neoprene CMC for computer use.    OT Occupational Profile and History Comprehensive Assessment- Review of records and extensive additional review of physical, cognitive, psychosocial history related to current functional performance    Occupational performance deficits (Please refer to evaluation for details): ADL's;IADL's;Leisure;Rest and Sleep;Work;Play;Social Participation    Body Structure / Function / Physical Skills ADL;Decreased knowledge of use of DME;Flexibility;ROM;UE functional use;Dexterity;Edema;Strength;Pain;IADL    Rehab Potential Good    Clinical Decision Making Several treatment options, min-mod task modification necessary  Comorbidities Affecting Occupational Performance: May have comorbidities impacting occupational performance    Modification or Assistance to Complete Evaluation  No modification of tasks or assist necessary to complete eval    OT Frequency 2x / week    OT Duration 8 weeks    OT  Treatment/Interventions Self-care/ADL training;Cryotherapy;Ultrasound;Iontophoresis;Paraffin;Fluidtherapy;Contrast Bath;DME and/or AE instruction;Manual Therapy;Passive range of motion;Splinting;Patient/family education;Dry needling;Therapeutic exercise    Consulted and Agree with Plan of Care Patient             Patient will benefit from skilled therapeutic intervention in order to improve the following deficits and impairments:   Body Structure / Function / Physical Skills: ADL, Decreased knowledge of use of DME, Flexibility, ROM, UE functional use, Dexterity, Edema, Strength, Pain, IADL       Visit Diagnosis: Pain in right hand  Stiffness of right hand, not elsewhere classified  Localized edema    Problem List Patient Active Problem List   Diagnosis Date Noted   Indication for care in labor and delivery, antepartum 04/29/2018   Preterm uterine contractions in second trimester, antepartum 02/06/2018   Pregnancy 12/29/2017   Hyperemesis arising during pregnancy 10/21/2017    Brenda Fox, OTR/L,CLT 03/01/2023, 2:36 PM  [facility] [address], [name], [address] Phone: [phone]   Fax:  [phone]  Name: Brenda Fox MRN: [id number] Date of Birth: 07/23/1991

## 2023-03-05 ENCOUNTER — Ambulatory Visit: Payer: Medicaid Other | Admitting: Occupational Therapy

## 2023-03-05 DIAGNOSIS — M6281 Muscle weakness (generalized): Secondary | ICD-10-CM

## 2023-03-05 DIAGNOSIS — M79641 Pain in right hand: Secondary | ICD-10-CM | POA: Diagnosis not present

## 2023-03-05 DIAGNOSIS — M25641 Stiffness of right hand, not elsewhere classified: Secondary | ICD-10-CM

## 2023-03-05 DIAGNOSIS — R600 Localized edema: Secondary | ICD-10-CM

## 2023-03-05 NOTE — Therapy (Signed)
[facility] [address], [name], [address] Phone: [phone]   Fax:  [phone]  Occupational Therapy Treatment  Patient Details  Name: Brenda Fox MRN: [id number] Date of Birth: December 22, 1990 Referring Provider (OT): Dr Landry Mellow   Encounter Date: 03/05/2023   OT End of Session - 03/05/23 0904     Visit Number 7    Number of Visits 16    Date for OT Re-Evaluation 03/13/23    OT Start Time 0904    OT Stop Time 0945    OT Time Calculation (min) 41 min    Activity Tolerance Patient tolerated treatment well;Patient limited by pain    Behavior During Therapy [facility] for tasks assessed/performed             Past Medical History:  Diagnosis Date   Cancer of cervix [facility])     Past Surgical History:  Procedure Laterality Date   procedure for cervical cancer      There were no vitals filed for this visit.   Subjective Assessment - 03/05/23 0904     Subjective  My son is better- then I got sick - so I stayed in bed this weekend - so my thumb and hand feels about the same    Pertinent History 12/13/22 Ortho note- was seen again 01/05/23 and refer to OT -  /Brenda Fox is a 32 y.o. female that presents to clinic today for evaluation and management of chronic hand pain and swelling at the referral of Elvin So, PA (neurology).  At the time of this visit, I reviewed her most recent evaluation by neurology for ongoing right hand pain suspected to be due to mild carpal tunnel syndrome seen on EMG from 10/30/2022. She has previously been evaluated by [facility] orthopedics on 09/26/2022 where she was recommended hand therapy, naproxen, and EMG for her MRI findings of mild multiple finger flexor tenosynovitis. She has initial evaluation by a local urgent care on 12/23/2021 after acute onset hand pain and swelling from her hand getting caught conveyor belt at work. She was suspected to have a hand strain treated with ibuprofen 800 mg.  Her  pain began almost 1 year ago when her hand was caught between a conveyor belt and metal. Since that time, she has had evaluation in multiple offices including MRI of the hand, EMG, x-ray. She has had treatment with naproxen, nortriptyline, tramadol, acetaminophen, brace, physical therapy (pivot; mostly given home exercise) with ongoing symptoms. There was question if her pain was coming from her underlying mild carpal tunnel syndrome so she was recommended consultation by orthopedics. The pain is located over her dorsal hand between the second and third metacarpal. Her pain can be deep to this area. She is starting to notice pain radiating up her dorsal forearm toward her elbow. She describes her pain as constant and dull, throbbing, aching, sharp, shooting. It is aggravated by using her hand, doing her hair, doing her make-up, writing. After she does these activities, she will notice swelling along her dorsal hand. She currently rates pain severity as a 4/10. She reports associated swelling, pain at night. She denies associated finger locking/catching, numbness or tingling, weakness, fevers or chills, night sweats, weight loss, skin color change. She feels like putting some pressure in the area does initially her feel better.  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    Patient Stated Goals Want my hand better -the pain , swelling and use so  I can use it at home , work    Currently in Pain? Yes    Pain Score 2     Pain Location --   Radial hand   Pain Orientation Right    Pain Descriptors / Indicators Aching;Tightness    Pain Type Acute pain    Pain Onset More than a month ago    Pain Frequency Constant                 Patient with report of swelling and pressure and discomfort mostly over the radial hand coming in 2/10 Was sick over the weekend. Did not do as much exercises.    No increased pain with palmar radial abduction with resistance or opposition or  flexion. Tenderness mostly in the webspace and over the ADD Pol     OT Treatments/Exercises (OP) - 03/05/23 0001       Ultrasound   Ultrasound Location volar webspace- ADD pol    Ultrasound Parameters 3.37mhz, 20%; 1.0 intensity    Ultrasound Goals Edema;Pain      RUE Contrast Bath   Time 8 minutes    Comments decrease edema and stiffness                                   Soft tissue mobs done gentle to webspace of thumb.  With gentle palmar/ radial abduction. Gentle carpal spreads patient reporting feeling opening palm - daughter helping her Reviewed with patient about opening the palm opening the carpal tunnel opening the metacarpals as well as thenar eminence, webspace. Patient tightness and tenderness improved greatly over the volar wrist and forearm- Add this date prayer stretch for wrist extention  BUT including thumb RA 10 reps  Hold 5 sec Followed by table slides 20 reps with thumb in slight radial abduction pain-free Then medial nerve glides 5 reps hold 5 seconds adding light stretch to thumb radial abduction Patient to do these 3 stretches every 2 hours while on the computer to address tightness in the ADD Pollicis   Continue with rubber band for palmar radial abduction 2 sets of 12 was able to do pain-free    Opposition to digits pain-free second through fourth. Slight pull to fifth.          OT Education - 03/05/23 0904     Education Details progress and changes to HEP and splint use    Person(s) Educated Patient    Methods Explanation;Demonstration;Tactile cues;Verbal cues;Handout    Comprehension Verbal cues required;Returned demonstration;Verbalized understanding                 OT Long Term Goals - 01/16/23 2038       OT LONG TERM GOAL #1   Title Pt to be ind in HEP to decrease pain/edema iwith AROM of digits, thumb and wrist to less than 2/10    Baseline pain increase to 5/10 with grip , prehension and wrsit ext , thumb AROM  in all planes and flexion/extention of digits- tenderness in webspace and thenar eminence 10/10    Time 4    Period Weeks    Status New    Target Date 02/13/23      OT LONG TERM GOAL #2   Title Pt to show decrease edema and pain to less than 2/10 with bathing, dressing and grooming    Baseline pain and edema increase in R hand after using ADL'ls - increase  to 8/10    Time 5    Period Weeks    Status New    Target Date 02/20/23      OT LONG TERM GOAL #3   Title Pain and edema improve in R hand and wrist to wean pt out of splints with ADL's    Baseline Thumb spica fitted to wear most all the time- CMC neoprene with use of computer mouse - pain 5-8/10 pain in dorsal hand and thenar eminence and webspace    Time 6    Period Weeks    Status New    Target Date 02/27/23      OT LONG TERM GOAL #4   Title Pt verbalize 3 modifications to tasks to decrease edema and pain in ADL's and IADL's    Baseline pain 5-8/10 with AROM - increase edema after use and pain - no knowledge of modifcations    Time 6    Period Weeks    Status New    Target Date 02/27/23      OT LONG TERM GOAL #5   Title R grip and prehension strength increase to WNL for her age without increase symptoms to use hand more than 75%    Baseline grip 20 , lat 15 and 3 point 16 - pain 5/10  pain - pain increase to 8/10 tenderenss in thumb webspace -    Time 8    Period Weeks    Status New    Target Date 03/13/23                   Plan - 03/05/23 4098     Clinical Impression Statement Pt present at OT eval -refer with hx of chronic hand pain and swelling since April 23 - when her hand got caught between 2 objects on conveyer belt - EMG on 10/30/22 showed mild carpal tunnel syndrome. MRI earlier this year showed mild multiple finger flexor tenosynovitis. She has had evaluation in multiple offices including MRI of the hand, EMG, x-ray. She has had treatment with naproxen, nortriptyline, tramadol, acetaminophen, brace,  physical therapy (pivot; mostly given home exercise) with ongoing symptoms. Pt show increase tenderness over thenar eminence,  and between 2nd and 3rd MC- 10/10 at eval.  Pt made great progress from [facility] -even with being away for work [place] and missing month. Pain decrease to 2/1o with tenderness and edema  webspace and thenar eminence- pain with thumb opposition , flexion and ABD - 2/10.  Pt to cont with CMC neoprene splint to use most of time and thumb spica  and glove night time.  Pt on computer 8 hrs day- pt to do composite extention with PA and RA of thumb every 2 hrs while at work -followed by 5 reps Med N glide. Cont at home with  modification to tasks to not increase symptoms.  Pt with increase edema ,pain and decrease ROM and strength all limiting her functional use of R dominant hand in ADL's and IADL's. Pt to cont with thumb spica splint to use most all the time. Neoprene CMC for computer use.    OT Occupational Profile and History Comprehensive Assessment- Review of records and extensive additional review of physical, cognitive, psychosocial history related to current functional performance    Occupational performance deficits (Please refer to evaluation for details): ADL's;IADL's;Leisure;Rest and Sleep;Work;Play;Social Participation    Body Structure / Function / Physical Skills ADL;Decreased knowledge of use of DME;Flexibility;ROM;UE functional use;Dexterity;Edema;Strength;Pain;IADL    Rehab Potential Good  Clinical Decision Making Several treatment options, min-mod task modification necessary    Comorbidities Affecting Occupational Performance: May have comorbidities impacting occupational performance    Modification or Assistance to Complete Evaluation  No modification of tasks or assist necessary to complete eval    OT Frequency 2x / week    OT Duration 8 weeks    OT Treatment/Interventions Self-care/ADL training;Cryotherapy;Ultrasound;Iontophoresis;Paraffin;Fluidtherapy;Contrast Bath;DME  and/or AE instruction;Manual Therapy;Passive range of motion;Splinting;Patient/family education;Dry needling;Therapeutic exercise    Consulted and Agree with Plan of Care Patient             Patient will benefit from skilled therapeutic intervention in order to improve the following deficits and impairments:   Body Structure / Function / Physical Skills: ADL, Decreased knowledge of use of DME, Flexibility, ROM, UE functional use, Dexterity, Edema, Strength, Pain, IADL       Visit Diagnosis: Pain in right hand  Stiffness of right hand, not elsewhere classified  Localized edema  Muscle weakness (generalized)    Problem List Patient Active Problem List   Diagnosis Date Noted   Indication for care in labor and delivery, antepartum 04/29/2018   Preterm uterine contractions in second trimester, antepartum 02/06/2018   Pregnancy 12/29/2017   Hyperemesis arising during pregnancy 10/21/2017    Brenda Fox, OTR/L,CLT 03/05/2023, 12:17 PM  [facility] [address], [name], [address] Phone: [phone]   Fax:  [phone]  Name: Brenda Fox MRN: [id number] Date of Birth: September 20, 1990

## 2023-03-09 ENCOUNTER — Ambulatory Visit: Payer: Medicaid Other | Admitting: Occupational Therapy

## 2023-03-09 DIAGNOSIS — R600 Localized edema: Secondary | ICD-10-CM

## 2023-03-09 DIAGNOSIS — M79641 Pain in right hand: Secondary | ICD-10-CM

## 2023-03-09 DIAGNOSIS — M6281 Muscle weakness (generalized): Secondary | ICD-10-CM

## 2023-03-09 DIAGNOSIS — M25641 Stiffness of right hand, not elsewhere classified: Secondary | ICD-10-CM

## 2023-03-09 NOTE — Therapy (Signed)
[facility] [address], [name], [address] Phone: [phone]   Fax:  [phone]  Occupational Therapy Treatment  Patient Details  Name: Brenda Fox MRN: [id number] Date of Birth: 1990/10/15 Referring Provider (OT): Dr Landry Mellow   Encounter Date: 03/09/2023   OT End of Session - 03/09/23 0833     Visit Number 8    Number of Visits 16    Date for OT Re-Evaluation 03/13/23    OT Start Time 0815    OT Stop Time 0900    OT Time Calculation (min) 45 min    Activity Tolerance Patient tolerated treatment well;Patient limited by pain    Behavior During Therapy [facility] for tasks assessed/performed             Past Medical History:  Diagnosis Date   Cancer of cervix [facility])     Past Surgical History:  Procedure Laterality Date   procedure for cervical cancer      There were no vitals filed for this visit.   Subjective Assessment - 03/09/23 0831     Subjective  I was doing great until yesterday morning - it was hurting and swollen - the hand and forearm- I think I slept on it    Pertinent History 12/13/22 Ortho note- was seen again 01/05/23 and refer to OT -  /Nilza Berkheimer is a 32 y.o. female that presents to clinic today for evaluation and management of chronic hand pain and swelling at the referral of Elvin So, PA (neurology).  At the time of this visit, I reviewed her most recent evaluation by neurology for ongoing right hand pain suspected to be due to mild carpal tunnel syndrome seen on EMG from 10/30/2022. She has previously been evaluated by [facility] orthopedics on 09/26/2022 where she was recommended hand therapy, naproxen, and EMG for her MRI findings of mild multiple finger flexor tenosynovitis. She has initial evaluation by a local urgent care on 12/23/2021 after acute onset hand pain and swelling from her hand getting caught conveyor belt at work. She was suspected to have a hand strain treated with ibuprofen 800 mg.   Her pain began almost 1 year ago when her hand was caught between a conveyor belt and metal. Since that time, she has had evaluation in multiple offices including MRI of the hand, EMG, x-ray. She has had treatment with naproxen, nortriptyline, tramadol, acetaminophen, brace, physical therapy (pivot; mostly given home exercise) with ongoing symptoms. There was question if her pain was coming from her underlying mild carpal tunnel syndrome so she was recommended consultation by orthopedics. The pain is located over her dorsal hand between the second and third metacarpal. Her pain can be deep to this area. She is starting to notice pain radiating up her dorsal forearm toward her elbow. She describes her pain as constant and dull, throbbing, aching, sharp, shooting. It is aggravated by using her hand, doing her hair, doing her make-up, writing. After she does these activities, she will notice swelling along her dorsal hand. She currently rates pain severity as a 4/10. She reports associated swelling, pain at night. She denies associated finger locking/catching, numbness or tingling, weakness, fevers or chills, night sweats, weight loss, skin color change. She feels like putting some pressure in the area does initially her feel better.  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    Patient Stated Goals Want my hand better -the pain , swelling and use so I can  use it at home , work    Currently in Pain? No/denies                      Patient arrived today with reports of doing great the last few days except yesterday when she had increased pain in the hand palm into the forearm.  After assessment figure out she slept on her hand without her brace on. Reviewed with patient positioning at nighttime Today patient arrived with no pain with passive range of motion for thumb in all planes as well as wrist. No tenderness except over thumb CMC.    OT Treatments/Exercises (OP) - 03/09/23  0001       RUE Fluidotherapy   Number Minutes Fluidotherapy 8 Minutes    RUE Fluidotherapy Location Hand;Wrist    Comments prior to ROM and soft tissue             Soft tissue mobs done gentle to webspace of thumb.  With gentle palmar/ radial abduction. Gentle carpal spreads patient reporting feeling opening palm - daughter helping her Reviewed with patient about opening the palm opening the carpal tunnel opening the metacarpals as well as thenar eminence, webspace. Patient tightness and tenderness improved greatly over the volar wrist and forearm- Done [place] tool #2 for sweeping. Patient is doing prayer stretch for wrist extention  BUT including thumb RA 10 reps  Hold 5 sec At composite forearm and flexor stretch on the wall 5 reps hold 5 seconds pain-free  Then medial nerve glides 5 reps hold 5 seconds adding light stretch to thumb radial abduction Patient to do these 3 stretches every 2 hours while on the computer to address tightness in the ADD Pollicis   Continue with rubber band for palmar radial abduction 2 sets of 12 was able to do pain-free  After active range of motion for palmar radial abduction At this date 1 pound weight loosely grip for supination pronation, wrist flexion extension, and radial and ulnar deviation 12 reps pain-free 1-2 times a day.       OT Education - 03/09/23 [phone]     Education Details progress and changes to HEP and splint use    Person(s) Educated Patient    Methods Explanation;Demonstration;Tactile cues;Verbal cues;Handout    Comprehension Verbal cues required;Returned demonstration;Verbalized understanding                 OT Long Term Goals - 01/16/23 2038       OT LONG TERM GOAL #1   Title Pt to be ind in HEP to decrease pain/edema iwith AROM of digits, thumb and wrist to less than 2/10    Baseline pain increase to 5/10 with grip , prehension and wrsit ext , thumb AROM in all planes and flexion/extention of digits-  tenderness in webspace and thenar eminence 10/10    Time 4    Period Weeks    Status New    Target Date 02/13/23      OT LONG TERM GOAL #2   Title Pt to show decrease edema and pain to less than 2/10 with bathing, dressing and grooming    Baseline pain and edema increase in R hand after using ADL'ls - increase to 8/10    Time 5    Period Weeks    Status New    Target Date 02/20/23      OT LONG TERM GOAL #3   Title Pain and edema improve in R hand and wrist to  wean pt out of splints with ADL's    Baseline Thumb spica fitted to wear most all the time- [facility] neoprene with use of computer mouse - pain 5-8/10 pain in dorsal hand and thenar eminence and webspace    Time 6    Period Weeks    Status New    Target Date 02/27/23      OT LONG TERM GOAL #4   Title Pt verbalize 3 modifications to tasks to decrease edema and pain in ADL's and IADL's    Baseline pain 5-8/10 with AROM - increase edema after use and pain - no knowledge of modifcations    Time 6    Period Weeks    Status New    Target Date 02/27/23      OT LONG TERM GOAL #5   Title R grip and prehension strength increase to WNL for her age without increase symptoms to use hand more than 75%    Baseline grip 20 , lat 15 and 3 point 16 - pain 5/10  pain - pain increase to 8/10 tenderenss in thumb webspace -    Time 8    Period Weeks    Status New    Target Date 03/13/23                   Plan - 03/09/23 0834     Clinical Impression Statement Pt present at OT eval -refer with hx of chronic hand pain and swelling since April 23 - when her hand got caught between 2 objects on conveyer belt - EMG on 10/30/22 showed mild carpal tunnel syndrome. MRI earlier this year showed mild multiple finger flexor tenosynovitis. She has had evaluation in multiple offices including MRI of the hand, EMG, x-ray. She has had treatment with naproxen, nortriptyline, tramadol, acetaminophen, brace, physical therapy (pivot; mostly given home  exercise) with ongoing symptoms. Pt show increase tenderness over thenar eminence,  and between 2nd and 3rd MC- 10/10 at eval.  Pt made great progress from [facility] -even with being away for work [place] and missing month. Pain decrease to 0/10 coming in today - she had  yesterday increase pain but figured out she slept on her hand. No increase pain in session with thumb AROM and PROM in all planes or wrist. Add ulnar N glide -and initiate 1 lbs weight for wrist and forearm in all planes - 12 reps pain free.   Pt to cont with CMC neoprene splint to use most of time and thumb spica  and glove night time. Cont at home with  modification to tasks to not increase symptoms.  Pt with increase edema ,pain and decrease ROM and strength all limiting her functional use of R dominant hand in ADL's and IADL's.    OT Occupational Profile and History Comprehensive Assessment- Review of records and extensive additional review of physical, cognitive, psychosocial history related to current functional performance    Occupational performance deficits (Please refer to evaluation for details): ADL's;IADL's;Leisure;Rest and Sleep;Work;Play;Social Participation    Body Structure / Function / Physical Skills ADL;Decreased knowledge of use of DME;Flexibility;ROM;UE functional use;Dexterity;Edema;Strength;Pain;IADL    Rehab Potential Good    Clinical Decision Making Several treatment options, min-mod task modification necessary    Comorbidities Affecting Occupational Performance: May have comorbidities impacting occupational performance    Modification or Assistance to Complete Evaluation  No modification of tasks or assist necessary to complete eval    OT Frequency 2x / week    OT Duration 8  weeks    OT Treatment/Interventions Self-care/ADL training;Cryotherapy;Ultrasound;Iontophoresis;Paraffin;Fluidtherapy;Contrast Bath;DME and/or AE instruction;Manual Therapy;Passive range of motion;Splinting;Patient/family education;Dry  needling;Therapeutic exercise    Consulted and Agree with Plan of Care Patient             Patient will benefit from skilled therapeutic intervention in order to improve the following deficits and impairments:   Body Structure / Function / Physical Skills: ADL, Decreased knowledge of use of DME, Flexibility, ROM, UE functional use, Dexterity, Edema, Strength, Pain, IADL       Visit Diagnosis: Pain in right hand  Localized edema  Muscle weakness (generalized)  Stiffness of right hand, not elsewhere classified    Problem List Patient Active Problem List   Diagnosis Date Noted   Indication for care in labor and delivery, antepartum 04/29/2018   Preterm uterine contractions in second trimester, antepartum 02/06/2018   Pregnancy 12/29/2017   Hyperemesis arising during pregnancy 10/21/2017    Oletta Cohn, OTR/L,CLT 03/09/2023, 11:06 AM  [facility] [address], [name], [address] Phone: [phone]   Fax:  [phone]  Name: Brenda Fox MRN: [id number] Date of Birth: 10/03/1990

## 2023-03-12 ENCOUNTER — Ambulatory Visit: Payer: Medicaid Other | Admitting: Occupational Therapy

## 2023-03-12 DIAGNOSIS — M6281 Muscle weakness (generalized): Secondary | ICD-10-CM

## 2023-03-12 DIAGNOSIS — M79641 Pain in right hand: Secondary | ICD-10-CM | POA: Diagnosis not present

## 2023-03-12 DIAGNOSIS — M25641 Stiffness of right hand, not elsewhere classified: Secondary | ICD-10-CM

## 2023-03-12 DIAGNOSIS — R600 Localized edema: Secondary | ICD-10-CM

## 2023-03-12 NOTE — Therapy (Signed)
[facility] [address] [name], [address] Phone: [phone]   Fax:  [phone]  Occupational Therapy Treatment  Patient Details  Name: Brenda Fox MRN: [id number] Date of Birth: [birth year] Referring Provider (OT): Dr Landry Mellow   Encounter Date: 03/12/2023   OT End of Session - 03/12/23 0943     Visit Number 9    Number of Visits 16    Date for OT Re-Evaluation 03/13/23    OT Start Time 0943    OT Stop Time 1017    OT Time Calculation (min) 34 min    Activity Tolerance Patient tolerated treatment well    Behavior During Therapy [facility] for tasks assessed/performed             Past Medical History:  Diagnosis Date   Cancer of cervix [facility])     Past Surgical History:  Procedure Laterality Date   procedure for cervical cancer      There were no vitals filed for this visit.   Subjective Assessment - 03/12/23 0943     Subjective  My hand has been hurting since Sat- so I did not do the weight or anything -only the hot and cold    Pertinent History 12/13/22 Ortho note- was seen again 01/05/23 and refer to OT -  /Brenda Fox is a 32 y.o. female that presents to clinic today for evaluation and management of chronic hand pain and swelling at the referral of Elvin So, PA (neurology).  At the time of this visit, I reviewed her most recent evaluation by neurology for ongoing right hand pain suspected to be due to mild carpal tunnel syndrome seen on EMG from 10/30/2022. She has previously been evaluated by [facility] orthopedics on 09/26/2022 where she was recommended hand therapy, naproxen, and EMG for her MRI findings of mild multiple finger flexor tenosynovitis. She has initial evaluation by a local urgent care on 12/23/2021 after acute onset hand pain and swelling from her hand getting caught conveyor belt at work. She was suspected to have a hand strain treated with ibuprofen 800 mg.  Her pain began almost 1 year ago when her  hand was caught between a conveyor belt and metal. Since that time, she has had evaluation in multiple offices including MRI of the hand, EMG, x-ray. She has had treatment with naproxen, nortriptyline, tramadol, acetaminophen, brace, physical therapy (pivot; mostly given home exercise) with ongoing symptoms. There was question if her pain was coming from her underlying mild carpal tunnel syndrome so she was recommended consultation by orthopedics. The pain is located over her dorsal hand between the second and third metacarpal. Her pain can be deep to this area. She is starting to notice pain radiating up her dorsal forearm toward her elbow. She describes her pain as constant and dull, throbbing, aching, sharp, shooting. It is aggravated by using her hand, doing her hair, doing her make-up, writing. After she does these activities, she will notice swelling along her dorsal hand. She currently rates pain severity as a 4/10. She reports associated swelling, pain at night. She denies associated finger locking/catching, numbness or tingling, weakness, fevers or chills, night sweats, weight loss, skin color change. She feels like putting some pressure in the area does initially her feel better.  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    Patient Stated Goals Want my hand better -the pain , swelling and use so I can use it at home , work  Currently in Pain? Yes    Pain Score 5     Pain Location --   radial hand   Pain Orientation Right    Pain Descriptors / Indicators Aching;Sore;Tender    Pain Type Acute pain    Pain Onset In the past 7 days    Pain Frequency Constant                  Coming in with increased tenderness and edema and pain over thumb thenar eminence Patient was pain-free last Friday and OT initiated 1 pound weight for wrist in all planes 12 reps. Patient with increased pain on Saturday. Did not do any strengthening. Pain coming in 5-6/10 thenar  eminence        OT Treatments/Exercises (OP) - 03/12/23 0001       Ultrasound   Ultrasound Location volar and dorsal thenar eminence    Ultrasound Parameters 3.21mhz, 1.0 intensity , 20 %    Ultrasound Goals Edema;Pain      RUE Contrast Bath   Time 8 minutes    Comments decrease edema and pain            Soft tissue mobs done gentle to webspace of thumb.  With gentle palmar/ radial abduction. Gentle carpal spreads patient reporting feeling opening palm - daughter helping her Reviewed with patient about opening the palm opening the carpal tunnel opening the metacarpals as well as thenar eminence, webspace pain free the next few days  After contrast Patient tightness and tenderness still with great progress over the volar wrist and forearm- Cont PA and RA of thumb -AAROM after soft tissue -and pain free  Can cont at home  And can Continue with rubber band for palmar radial abduction 2 sets of 12 reps if pain free    Pain decrease to 3/10 Pt to can start back if pain free- opposition          OT Education - 03/12/23 0943     Education Details progress and changes to HEP and splint use    Person(s) Educated Patient    Methods Explanation;Demonstration;Tactile cues;Verbal cues;Handout    Comprehension Verbal cues required;Returned demonstration;Verbalized understanding                 OT Long Term Goals - 01/16/23 2038       OT LONG TERM GOAL #1   Title Pt to be ind in HEP to decrease pain/edema iwith AROM of digits, thumb and wrist to less than 2/10    Baseline pain increase to 5/10 with grip , prehension and wrsit ext , thumb AROM in all planes and flexion/extention of digits- tenderness in webspace and thenar eminence 10/10    Time 4    Period Weeks    Status New    Target Date 02/13/23      OT LONG TERM GOAL #2   Title Pt to show decrease edema and pain to less than 2/10 with bathing, dressing and grooming    Baseline pain and edema increase in R  hand after using ADL'ls - increase to 8/10    Time 5    Period Weeks    Status New    Target Date 02/20/23      OT LONG TERM GOAL #3   Title Pain and edema improve in R hand and wrist to wean pt out of splints with ADL's    Baseline Thumb spica fitted to wear most all the time- CMC neoprene with use  of computer mouse - pain 5-8/10 pain in dorsal hand and thenar eminence and webspace    Time 6    Period Weeks    Status New    Target Date 02/27/23      OT LONG TERM GOAL #4   Title Pt verbalize 3 modifications to tasks to decrease edema and pain in ADL's and IADL's    Baseline pain 5-8/10 with AROM - increase edema after use and pain - no knowledge of modifcations    Time 6    Period Weeks    Status New    Target Date 02/27/23      OT LONG TERM GOAL #5   Title R grip and prehension strength increase to WNL for her age without increase symptoms to use hand more than 75%    Baseline grip 20 , lat 15 and 3 point 16 - pain 5/10  pain - pain increase to 8/10 tenderenss in thumb webspace -    Time 8    Period Weeks    Status New    Target Date 03/13/23                   Plan - 03/12/23 0943     Clinical Impression Statement Pt present at OT eval -refer with hx of chronic hand pain and swelling since April 23 - when her hand got caught between 2 objects on conveyer belt - EMG on 10/30/22 showed mild carpal tunnel syndrome. MRI earlier this year showed mild multiple finger flexor tenosynovitis. She has had evaluation in multiple offices including MRI of the hand, EMG, x-ray. She has had treatment with naproxen, nortriptyline, tramadol, acetaminophen, brace, physical therapy (pivot; mostly given home exercise) with ongoing symptoms. Pt show increase tenderness over thenar eminence,  and between 2nd and 3rd MC- 10/10 at eval.  Pt made great progress from [facility] -even with being away for work [place] and missing month. Pain decrease to 0/10 last week with AROM and was able to initiate 1 lbs  weight but then had increase pain in thenar eminence the next day. Pt this date with pain,tenderness and edema over thenar eminence 5-6/10 coming in - decrease to 3/10 end of sesssion.  Pt to cont with CMC neoprene splint to use most of time and thumb spica  and glove night time. Focus on decrease pain , edema and increase pain free AROM in R hand to WNL.  Cont at home with  modification to tasks to not increase symptoms.  Pt with increase edema ,pain and decrease ROM and strength all limiting her functional use of R dominant hand in ADL's and IADL's.    OT Occupational Profile and History Comprehensive Assessment- Review of records and extensive additional review of physical, cognitive, psychosocial history related to current functional performance    Occupational performance deficits (Please refer to evaluation for details): ADL's;IADL's;Leisure;Rest and Sleep;Work;Play;Social Participation    Body Structure / Function / Physical Skills ADL;Decreased knowledge of use of DME;Flexibility;ROM;UE functional use;Dexterity;Edema;Strength;Pain;IADL    Rehab Potential Good    Clinical Decision Making Several treatment options, min-mod task modification necessary    Comorbidities Affecting Occupational Performance: May have comorbidities impacting occupational performance    Modification or Assistance to Complete Evaluation  No modification of tasks or assist necessary to complete eval    OT Frequency 2x / week    OT Duration 8 weeks    OT Treatment/Interventions Self-care/ADL training;Cryotherapy;Ultrasound;Iontophoresis;Paraffin;Fluidtherapy;Contrast Bath;DME and/or AE instruction;Manual Therapy;Passive range of motion;Splinting;Patient/family education;Dry needling;Therapeutic exercise  Consulted and Agree with Plan of Care Patient             Patient will benefit from skilled therapeutic intervention in order to improve the following deficits and impairments:   Body Structure / Function / Physical  Skills: ADL, Decreased knowledge of use of DME, Flexibility, ROM, UE functional use, Dexterity, Edema, Strength, Pain, IADL       Visit Diagnosis: Pain in right hand  Localized edema  Muscle weakness (generalized)  Stiffness of right hand, not elsewhere classified    Problem List Patient Active Problem List   Diagnosis Date Noted   Indication for care in labor and delivery, antepartum 04/29/2018   Preterm uterine contractions in second trimester, antepartum 02/06/2018   Pregnancy 12/29/2017   Hyperemesis arising during pregnancy 10/21/2017    Oletta Cohn, OTR/L,CLT 03/12/2023, 10:24 AM  [facility] [place] [facility] [address] [name], [address] Phone: [phone]   Fax:  [phone]  Name: Brenda Fox MRN: [id number] Date of Birth: [birth year]

## 2023-03-16 ENCOUNTER — Ambulatory Visit: Payer: Medicaid Other | Admitting: Occupational Therapy

## 2023-03-20 ENCOUNTER — Ambulatory Visit: Payer: Medicaid Other | Attending: Sports Medicine | Admitting: Occupational Therapy

## 2023-03-20 DIAGNOSIS — M79641 Pain in right hand: Secondary | ICD-10-CM | POA: Insufficient documentation

## 2023-03-20 DIAGNOSIS — R600 Localized edema: Secondary | ICD-10-CM | POA: Diagnosis present

## 2023-03-20 DIAGNOSIS — M25641 Stiffness of right hand, not elsewhere classified: Secondary | ICD-10-CM | POA: Insufficient documentation

## 2023-03-20 DIAGNOSIS — M6281 Muscle weakness (generalized): Secondary | ICD-10-CM | POA: Insufficient documentation

## 2023-03-20 NOTE — Therapy (Signed)
[facility] [address] [name], [address] Phone: [phone]   Fax:  [phone]  Occupational Therapy Treatment/10th visit  Patient Details  Name: Brenda Fox MRN: [id number] Date of Birth: Sep 21, 1990 Referring Provider (OT): Dr Landry Mellow   Encounter Date: 03/20/2023   OT End of Session - 03/20/23 1144     Visit Number 10    Number of Visits 16    Date for OT Re-Evaluation 05/15/23    OT Start Time 1120    OT Stop Time 1155    OT Time Calculation (min) 35 min    Activity Tolerance Patient tolerated treatment well    Behavior During Therapy [facility] for tasks assessed/performed             Past Medical History:  Diagnosis Date   Cancer of cervix [facility])     Past Surgical History:  Procedure Laterality Date   procedure for cervical cancer      There were no vitals filed for this visit.   Subjective Assessment - 03/20/23 1142     Subjective  Had no pain since I seen you - did ure the computer mouse yesterday-and had to do team building activities that I used my hand -and had no pain    Pertinent History 12/13/22 Ortho note- was seen again 01/05/23 and refer to OT -  /Brenda Fox is a 32 y.o. female that presents to clinic today for evaluation and management of chronic hand pain and swelling at the referral of Elvin So, PA (neurology).  At the time of this visit, I reviewed her most recent evaluation by neurology for ongoing right hand pain suspected to be due to mild carpal tunnel syndrome seen on EMG from 10/30/2022. She has previously been evaluated by [facility] orthopedics on 09/26/2022 where she was recommended hand therapy, naproxen, and EMG for her MRI findings of mild multiple finger flexor tenosynovitis. She has initial evaluation by a local urgent care on 12/23/2021 after acute onset hand pain and swelling from her hand getting caught conveyor belt at work. She was suspected to have a hand strain treated with  ibuprofen 800 mg.  Her pain began almost 1 year ago when her hand was caught between a conveyor belt and metal. Since that time, she has had evaluation in multiple offices including MRI of the hand, EMG, x-ray. She has had treatment with naproxen, nortriptyline, tramadol, acetaminophen, brace, physical therapy (pivot; mostly given home exercise) with ongoing symptoms. There was question if her pain was coming from her underlying mild carpal tunnel syndrome so she was recommended consultation by orthopedics. The pain is located over her dorsal hand between the second and third metacarpal. Her pain can be deep to this area. She is starting to notice pain radiating up her dorsal forearm toward her elbow. She describes her pain as constant and dull, throbbing, aching, sharp, shooting. It is aggravated by using her hand, doing her hair, doing her make-up, writing. After she does these activities, she will notice swelling along her dorsal hand. She currently rates pain severity as a 4/10. She reports associated swelling, pain at night. She denies associated finger locking/catching, numbness or tingling, weakness, fevers or chills, night sweats, weight loss, skin color change. She feels like putting some pressure in the area does initially her feel better.  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    Patient Stated Goals Want my hand better -the pain , swelling and use  so I can use it at home , work    Currently in Pain? No/denies                [facility] OT Assessment - 03/20/23 0001       AROM   Right Wrist Extension 70 Degrees    Right Wrist Flexion 95 Degrees    Right Wrist Radial Deviation 20 Degrees    Right Wrist Ulnar Deviation 30 Degrees      Strength   Right Hand Grip (lbs) 42    Right Hand Lateral Pinch 19 lbs    Right Hand 3 Point Pinch 17 lbs    Left Hand Grip (lbs) 70    Left Hand Lateral Pinch 19 lbs    Left Hand 3 Point Pinch 18 lbs              Patient arrive  after reporting no pain for the last 4 days.  Patient was out of town but still used computer yesterday.  Patient still continues to wear CMC neoprene during the day as well as thumb spica at nighttime. Daughter assisting her with a lot of homemaking activities. Patient using right hand about 25%. Reviewed with patient again modifications and avoiding tight thumb lateral pinch and abduction. Strength in wrist 5/5 but fatigue.  Pain-free. Thumb palmar radial abduction today 5/5 pain-free Patient to increase activities 3-5 activities every 3 days pain-free Continue to modify        OT Treatments/Exercises (OP) - 03/20/23 0001       Moist Heat Therapy   Number Minutes Moist Heat 8 Minutes    Moist Heat Location Hand      Ultrasound   Ultrasound Location volar and dorsal thenar eminence    Ultrasound Parameters 3.30mhz, 1.0 intensity , 20%    Ultrasound Goals Edema;Pain           Soft tissue massage to webspace with palmar and radial abduction stretches of thumb. Patient to do several times during the day with increasing activities To continue to do contrast With webspace massages With thumb palmar radial abduction stretches Rubber band for strengthening of palmar radial abduction And medial nerve glide with a gentle stretch to thumb extension         OT Education - 03/20/23 1144     Education Details progress and changes to HEP and splint use    Person(s) Educated Patient    Methods Explanation;Demonstration;Tactile cues;Verbal cues;Handout    Comprehension Verbal cues required;Returned demonstration;Verbalized understanding                 OT Long Term Goals - 03/20/23 1155       OT LONG TERM GOAL #1   Title Pt to be ind in HEP to decrease pain/edema iwith AROM of digits, thumb and wrist to less than 2/10    Status Achieved      OT LONG TERM GOAL #2   Title Pt to show decrease edema and pain to less than 2/10 with bathing, dressing and grooming     Status Achieved      OT LONG TERM GOAL #3   Title Pain and edema improve in R hand and wrist to wean pt out of splints with ADL's    Baseline no pain for the last 4 days using CMC during day and night time prefab thumb spica    Time 6    Period Weeks    Status On-going    Target Date 05/01/23  OT LONG TERM GOAL #4   Title Pt verbalize 3 modifications to tasks to decrease edema and pain in ADL's and IADL's    Status Achieved      OT LONG TERM GOAL #5   Title R grip and prehension strength increase to WNL for her age without increase symptoms to use hand more than 75%    Baseline grip 20 , lat 15 and 3 point 16 - pain 5/10  pain - pain increase to 8/10 tenderenss in thumb webspace - NOW no pain for 4 days- and prehension strength 19/17 lbs, no pain- Grip 42 but fear for pain- using hand less than 25 % in ADL"s and IADL's    Time 8    Period Weeks    Status On-going    Target Date 05/15/23                   Plan - 03/20/23 1155     Clinical Impression Statement Pt was seen today for 10th visit - was refered with hx of chronic hand pain and swelling since April 23 - when her hand got caught between 2 objects on conveyer belt - EMG on 10/30/22 showed mild carpal tunnel syndrome. MRI earlier this year showed mild multiple finger flexor tenosynovitis. She has had evaluation in multiple offices including MRI of the hand, EMG, x-ray. She has had treatment with naproxen, nortriptyline, tramadol, acetaminophen, brace, physical therapy (pivot; mostly given home exercise) with ongoing symptoms. Pt showed increase tenderness over thenar eminence,  and between 2nd and 3rd MC- 10/10 at eval.  NOW at 10th visit pt with no pain sicne 4 days ago still wearing CMC neoprene during day and thumb spica night time- using her hand in ADL's and IADL's about 25% - but normally on computer during day. Grip and prehension strenght increase greatly. AROM for wrist and thumb this date WNL pain free.  Pt to  cont with CMC neoprene splint during day and night time thumb spica. - Pt to increase functional use pain free and modified 5 activites every 3 days.  Cont at home with  modification to tasks to not increase symptoms.  Pt  cont to benefit frmo OT services to decrease pain and increase  ROM and strength with pain free functional use of R dominant hand in ADL's and IADL's.    OT Occupational Profile and History Comprehensive Assessment- Review of records and extensive additional review of physical, cognitive, psychosocial history related to current functional performance    Occupational performance deficits (Please refer to evaluation for details): ADL's;IADL's;Leisure;Rest and Sleep;Work;Play;Social Participation    Body Structure / Function / Physical Skills ADL;Decreased knowledge of use of DME;Flexibility;ROM;UE functional use;Dexterity;Edema;Strength;Pain;IADL    Rehab Potential Good    Clinical Decision Making Several treatment options, min-mod task modification necessary    Comorbidities Affecting Occupational Performance: May have comorbidities impacting occupational performance    Modification or Assistance to Complete Evaluation  No modification of tasks or assist necessary to complete eval    OT Frequency 1x / week    OT Duration 8 weeks    OT Treatment/Interventions Self-care/ADL training;Cryotherapy;Ultrasound;Iontophoresis;Paraffin;Fluidtherapy;Contrast Bath;DME and/or AE instruction;Manual Therapy;Passive range of motion;Splinting;Patient/family education;Dry needling;Therapeutic exercise    Consulted and Agree with Plan of Care Patient             Patient will benefit from skilled therapeutic intervention in order to improve the following deficits and impairments:   Body Structure / Function / Physical Skills: ADL, Decreased knowledge of  use of DME, Flexibility, ROM, UE functional use, Dexterity, Edema, Strength, Pain, IADL       Visit Diagnosis: Pain in right  hand  Localized edema  Muscle weakness (generalized)  Stiffness of right hand, not elsewhere classified    Problem List Patient Active Problem List   Diagnosis Date Noted   Indication for care in labor and delivery, antepartum 04/29/2018   Preterm uterine contractions in second trimester, antepartum 02/06/2018   Pregnancy 12/29/2017   Hyperemesis arising during pregnancy 10/21/2017    Oletta Cohn, OTR/L,CLT 03/20/2023, 12:02 PM  [place] [facility] [address] [name], [address] Phone: [phone]   Fax:  [phone]  Name: Brenda Fox MRN: [id number] Date of Birth: 09-30-1990

## 2023-03-23 ENCOUNTER — Ambulatory Visit: Payer: Medicaid Other | Admitting: Occupational Therapy

## 2023-03-27 IMAGING — CT CT HEAD W/O CM
3 series · 16 of 47 positions shown, 19 images · non-contrast
Comparison: 11/08/2020

CLINICAL DATA: Headaches and right-sided numbness

EXAM:
CT HEAD WITHOUT CONTRAST
TECHNIQUE: Contiguous axial images were obtained from the base of the skull
through the vertex without intravenous contrast.

[Series 3: coronal soft tissue · coronal · 0.30mm/px · 3 of 64 slices shown]
[im 22/64  brain]
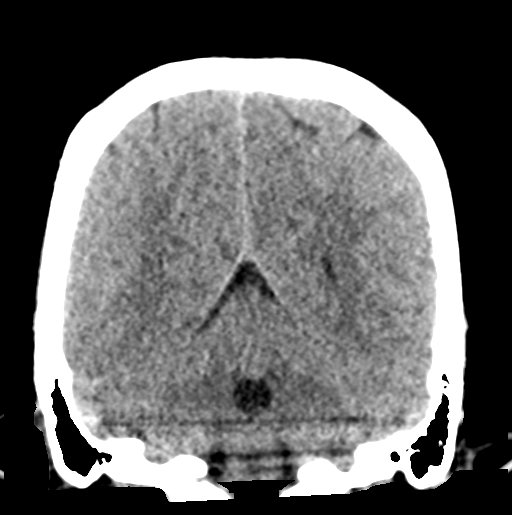
[im 29/64  brain]
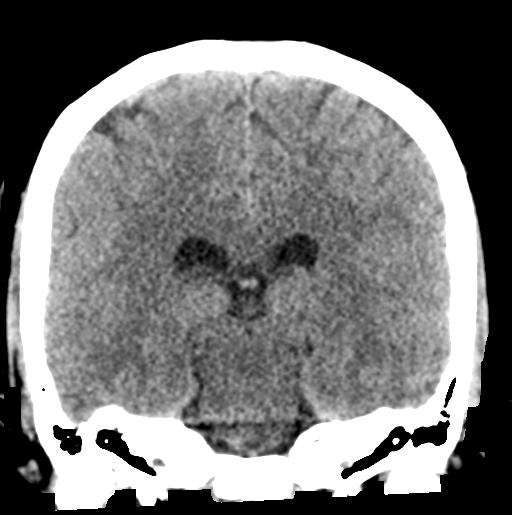
[im 36/64  brain]
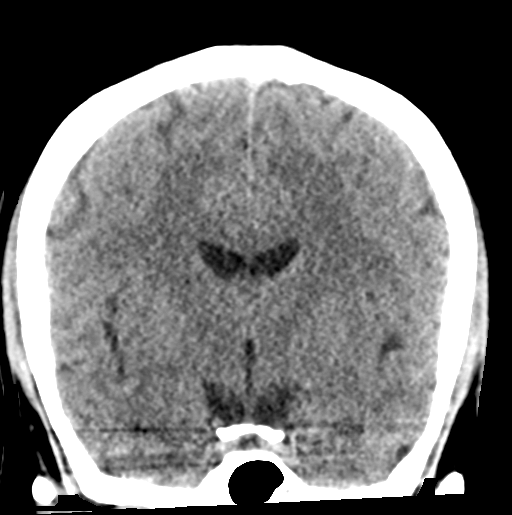

[Series 4: sagittal soft tissue · sagittal · 0.30mm/px · 3 of 51 slices shown]
[im 17/51  brain]
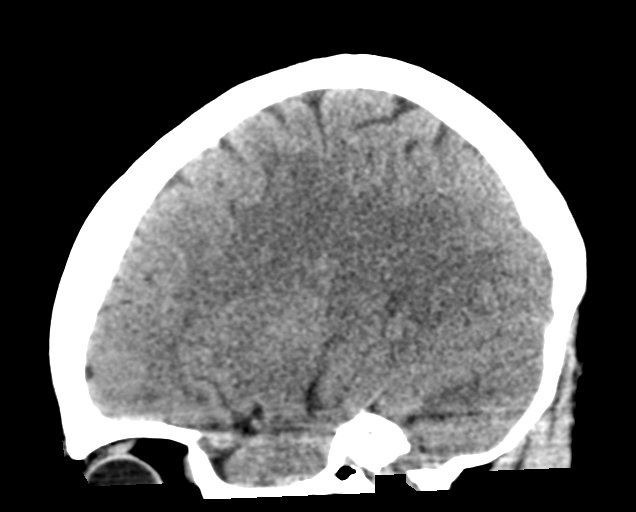
[im 26/51  brain]
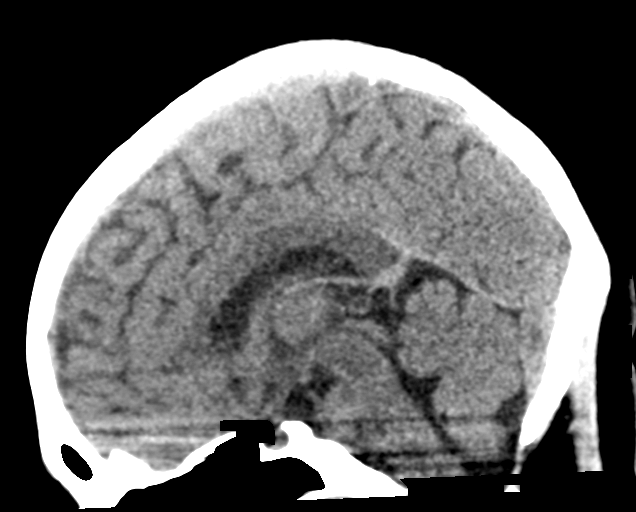
[im 34/51  brain]
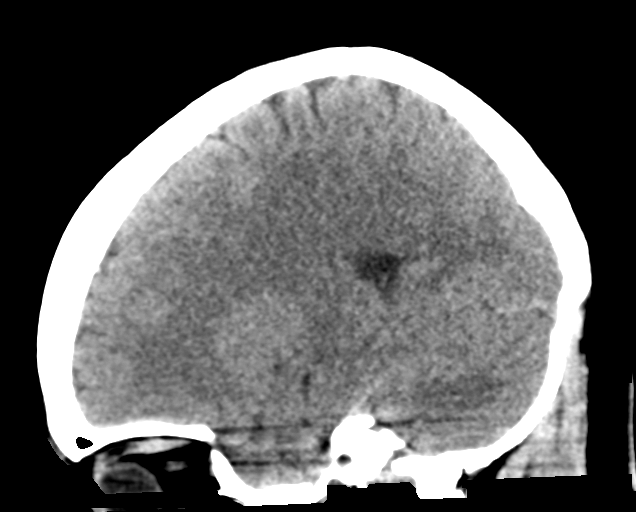

[Series 5: head wo · axial · 0.42mm/px · z∈[-115,+10]mm · 10 of 30 slices shown, 13 images]
[im 3/30  brain]
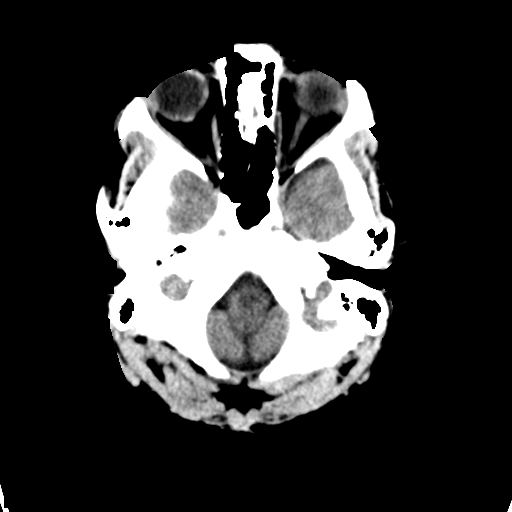
[im 3/30  bone]
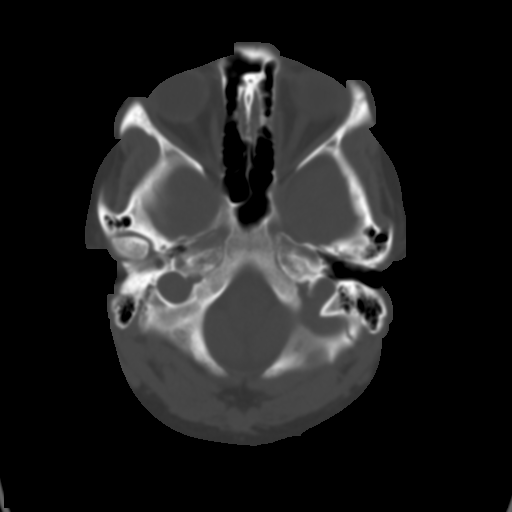
[im 6/30  brain]
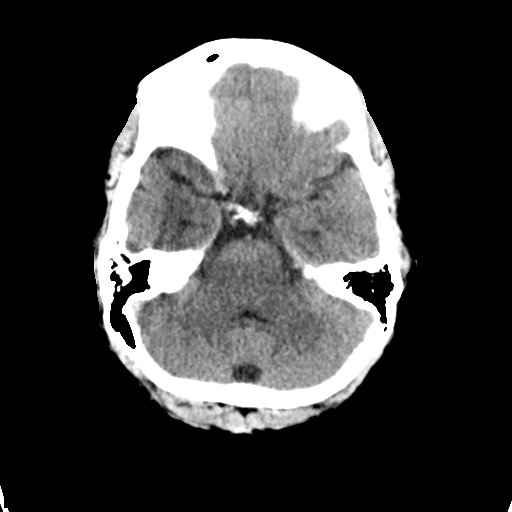
[im 9/30  brain]
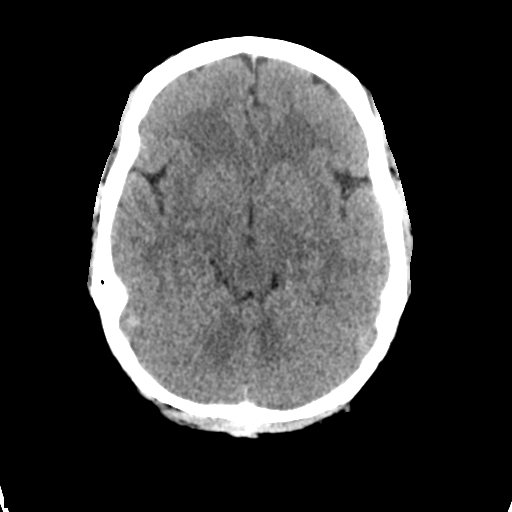
[im 11/30  brain]
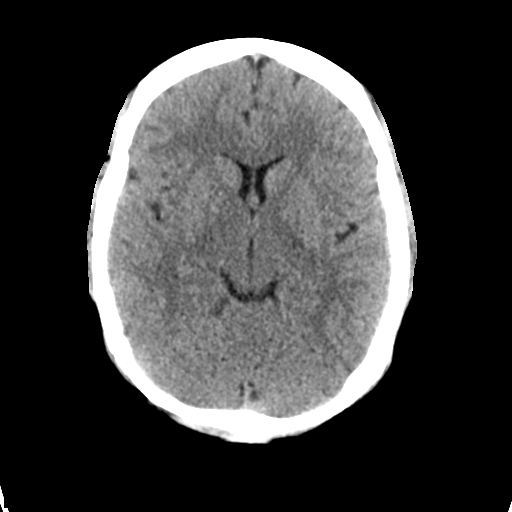
[im 14/30  brain]
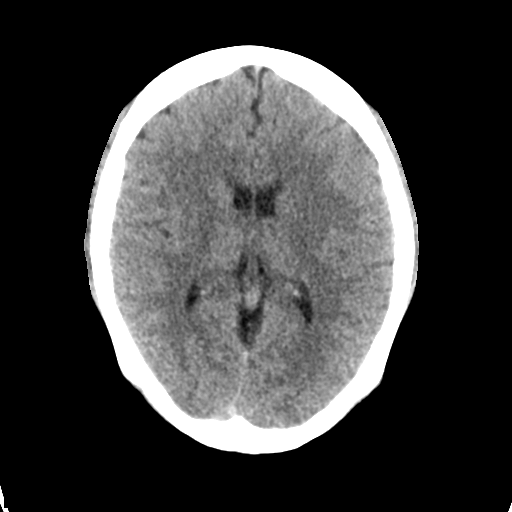
[im 14/30  bone]
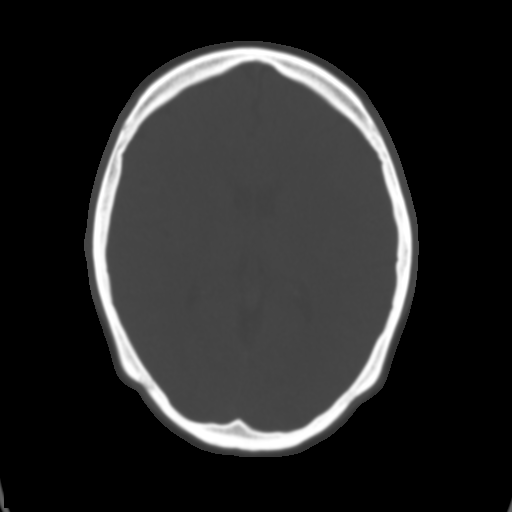
[im 17/30  brain]
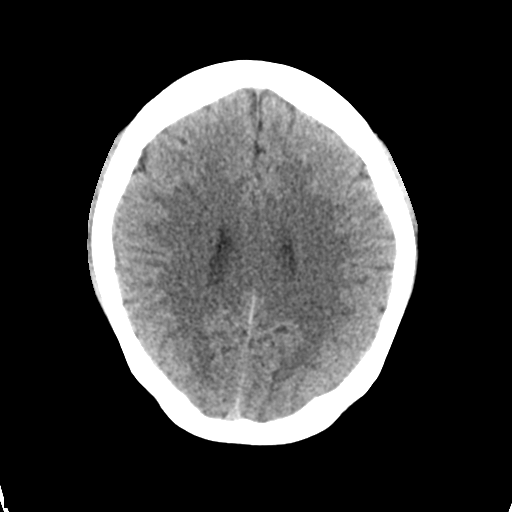
[im 20/30  brain]
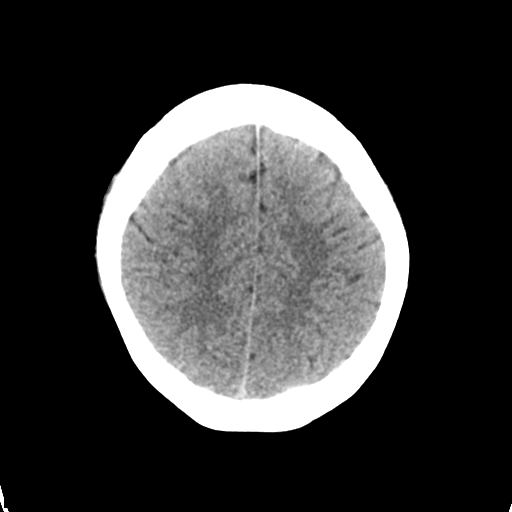
[im 23/30  brain]
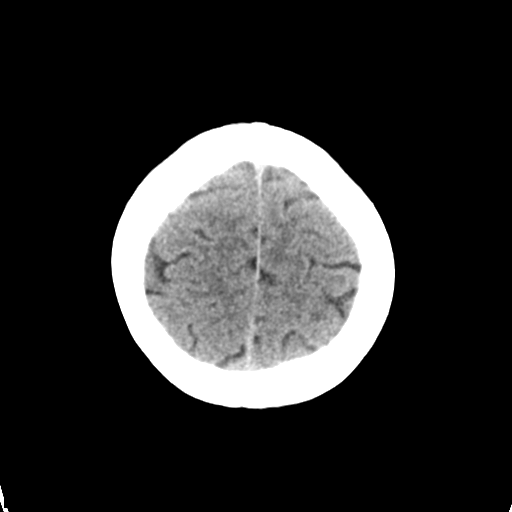
[im 25/30  brain]
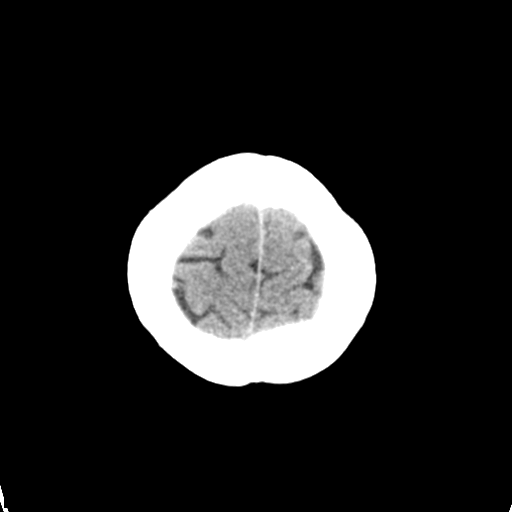
[im 25/30  bone]
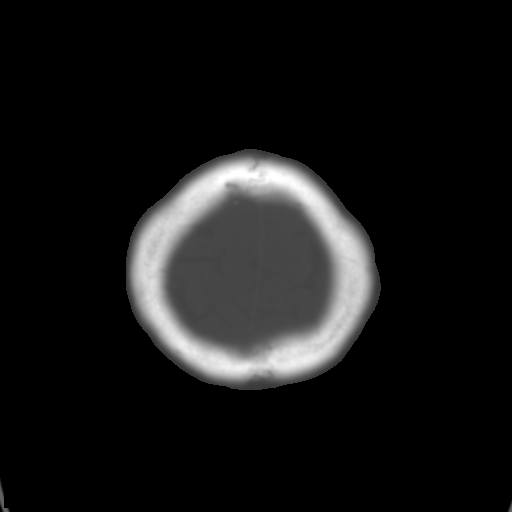
[im 28/30  brain]
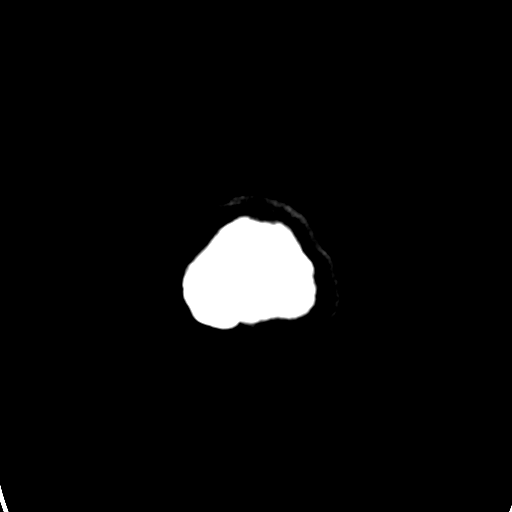

[16 of 47 positions shown; findings below may reference images not displayed]

FINDINGS: Brain: No evidence of acute infarction, hemorrhage, hydrocephalus,
extra-axial collection or mass lesion/mass effect.

Vascular: No hyperdense vessel or unexpected calcification.

Skull: Normal. Negative for fracture or focal lesion.

Sinuses/Orbits: No acute finding.

Other: None.
IMPRESSION: No acute intracranial abnormality noted.

## 2023-03-28 ENCOUNTER — Ambulatory Visit: Payer: Medicaid Other | Admitting: Occupational Therapy

## 2023-03-28 DIAGNOSIS — M6281 Muscle weakness (generalized): Secondary | ICD-10-CM

## 2023-03-28 DIAGNOSIS — M25641 Stiffness of right hand, not elsewhere classified: Secondary | ICD-10-CM

## 2023-03-28 DIAGNOSIS — R600 Localized edema: Secondary | ICD-10-CM

## 2023-03-28 DIAGNOSIS — M79641 Pain in right hand: Secondary | ICD-10-CM

## 2023-03-28 NOTE — Therapy (Signed)
Brenda Fox Fox Brenda [facility] [address], [name], [address] Phone: [phone]   Fax:  [phone]  Occupational Therapy Treatment  Patient Details  Name: Brenda Fox MRN: [id number] Date of Birth: 1990/10/30 Referring Provider (OT): Dr Brenda Fox   Encounter Date: 03/28/2023   OT End of Session - 03/28/23 1450     Visit Number 11    Number of Visits 16    Date for OT Re-Evaluation 05/15/23    OT Start Time 1400    OT Stop Time 1440    OT Time Calculation (min) 40 min    Activity Tolerance Patient tolerated treatment well    Behavior During Therapy Brenda Fox for tasks assessed/performed             Past Medical History:  Diagnosis Date   Cancer of cervix Brenda Fox)     Past Surgical History:  Procedure Laterality Date   procedure for cervical cancer      There were no vitals filed for this visit.   Subjective Assessment - 03/28/23 1448     Subjective  Doing okay -had little pain in webspace yesterday but done the massage and stretches - using my hand about 25% - but nothing cooking, wet laundry - enlarging still grip and use palm    Pertinent History 12/13/22 Ortho note- was seen again 01/05/23 and refer to OT -  /Brenda Fox is a 32 y.o. female that presents to clinic today for evaluation and management of chronic hand pain and swelling at the referral of Brenda So, PA (neurology).  At the time of this visit, I reviewed her most recent evaluation by neurology for ongoing right hand pain suspected to be due to mild carpal tunnel syndrome seen on EMG from 10/30/2022. She has previously been evaluated by [facility] on 09/26/2022 where she was recommended hand therapy, naproxen, and EMG for her MRI findings of mild multiple finger flexor tenosynovitis. She has initial evaluation by a local urgent care on 12/23/2021 after acute onset hand pain and swelling from her hand getting caught conveyor belt at work. She was suspected to have a hand  strain treated with ibuprofen 800 mg.  Her pain began almost 1 year ago when her hand was caught between a conveyor belt and metal. Since that time, she has had evaluation in multiple offices including MRI of the hand, EMG, x-ray. She has had treatment with naproxen, nortriptyline, tramadol, acetaminophen, brace, physical therapy (pivot; mostly given home exercise) with ongoing symptoms. There was question if her pain was coming from her underlying mild carpal tunnel syndrome Fox she was recommended consultation by orthopedics. The pain is located over her dorsal hand between the second and third metacarpal. Her pain can be deep to this area. She is starting to notice pain radiating up her dorsal forearm toward her elbow. She describes her pain as constant and dull, throbbing, aching, sharp, shooting. It is aggravated by using her hand, doing her hair, doing her make-up, writing. After she does these activities, she will notice swelling along her dorsal hand. She currently rates pain severity as a 4/10. She reports associated swelling, pain at night. She denies associated finger locking/catching, numbness or tingling, weakness, fevers or chills, night sweats, weight loss, skin color change. She feels like putting some pressure in the area does initially her feel better.  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    Patient Stated Goals Want my hand better -the pain ,  swelling and use Fox I can use it at home , work    Currently in Pain? Yes    Pain Score 1     Pain Location --   webspace   Pain Orientation Right    Pain Descriptors / Indicators Tightness;Spasm;Tender    Pain Type Acute pain                [facility] OT Assessment - 03/28/23 0001       Strength   Right Hand Grip (lbs) 45    Right Hand Lateral Pinch 19 lbs    Right Hand 3 Point Pinch 16 lbs   2/10 pain webspace                     OT Treatments/Exercises (OP) - 03/28/23 0001       Ultrasound    Ultrasound Location volar and dorsal webspace    Ultrasound Parameters 3.58mhz, 1.0 intensity , 20%    Ultrasound Goals Pain      RUE Contrast Bath   Time 8 minutes    Comments decrease pain , increase ROM             Patient arrive  with reports of pain still really good- since yesterday maybe about 1/10 - tenderness in webspace - over ADD pollicis Done some massage and doing better-  Using it more - about 25% Patient still continues to wear CMC neoprene during the day as well as thumb spica at nighttime. Daughter assisting her with a lot of homemaking activities.  Reviewed with patient again modifications and avoiding tight thumb lateral pinch and abduction. Strength in wrist 5/5 but fatigue.  Pain-free. Thumb palmar radial abduction today 5/5 pain-free Soft tissue mobs done by OT for webspace- trigger point release With PA and RA PROM /stretches Add wall composite forearm and hand extensor stretch -10 x hold 5 sec - no pressure thru palm  Patient to increase activities 1-2 activities every 2  days pain-free Continue to modify       OT Education - 03/28/23 1450     Education Details progress and changes to HEP and splint use    Person(s) Educated Patient    Methods Explanation;Demonstration;Tactile cues;Verbal cues;Handout    Comprehension Verbal cues required;Returned demonstration;Verbalized understanding                 OT Long Term Goals - 03/20/23 1155       OT LONG TERM GOAL #1   Title Pt to be ind in HEP to decrease pain/edema iwith AROM of digits, thumb and wrist to less than 2/10    Status Achieved      OT LONG TERM GOAL #2   Title Pt to show decrease edema and pain to less than 2/10 with bathing, dressing and grooming    Status Achieved      OT LONG TERM GOAL #3   Title Pain and edema improve in R hand and wrist to wean pt out of splints with ADL's    Baseline no pain for the last 4 days using CMC during day and night time prefab thumb spica     Time 6    Period Weeks    Status On-going    Target Date 05/01/23      OT LONG TERM GOAL #4   Title Pt verbalize 3 modifications to tasks to decrease edema and pain in ADL's and IADL's    Status Achieved  OT LONG TERM GOAL #5   Title R grip and prehension strength increase to WNL for her age without increase symptoms to use hand more than 75%    Baseline grip 20 , lat 15 and 3 point 16 - pain 5/10  pain - pain increase to 8/10 tenderenss in thumb webspace - NOW no pain for 4 days- and prehension strength 19/17 lbs, no pain- Grip 42 but fear for pain- using hand less than 25 % in ADL"s and IADL's    Time 8    Period Weeks    Status On-going    Target Date 05/15/23                   Plan - 03/28/23 1452     OT Occupational Profile and History Comprehensive Assessment- Review of records and extensive additional review of physical, cognitive, psychosocial history related to current functional performance    Occupational performance deficits (Please refer to evaluation for details): ADL's;IADL's;Leisure;Rest and Sleep;Work;Play;Social Participation    Body Structure / Function / Physical Skills ADL;Decreased knowledge of use of DME;Flexibility;ROM;UE functional use;Dexterity;Edema;Strength;Pain;IADL    Rehab Potential Good    Clinical Decision Making Several treatment options, min-mod task modification necessary    Comorbidities Affecting Occupational Performance: May have comorbidities impacting occupational performance    Modification or Assistance to Complete Evaluation  No modification of tasks or assist necessary to complete eval    OT Frequency 1x / week    OT Duration 8 weeks    OT Treatment/Interventions Self-care/ADL training;Cryotherapy;Ultrasound;Iontophoresis;Paraffin;Fluidtherapy;Contrast Bath;DME and/or AE instruction;Manual Therapy;Passive range of motion;Splinting;Patient/family education;Dry needling;Therapeutic exercise    Consulted and Agree with Plan of  Care Patient             Patient will benefit from skilled therapeutic intervention in order to improve the following deficits and impairments:   Body Structure / Function / Physical Skills: ADL, Decreased knowledge of use of DME, Flexibility, ROM, UE functional use, Dexterity, Edema, Strength, Pain, IADL       Visit Diagnosis: Pain in right hand  Localized edema  Muscle weakness (generalized)  Stiffness of right hand, not elsewhere classified    Problem List Patient Active Problem List   Diagnosis Date Noted   Indication for care in labor and delivery, antepartum 04/29/2018   Preterm uterine contractions in second trimester, antepartum 02/06/2018   Pregnancy 12/29/2017   Hyperemesis arising during pregnancy 10/21/2017    Oletta Cohn, OTR/L,CLT 03/28/2023, 2:54 PM  [facility] [address] [name], [address] Phone: [phone]   Fax:  [phone]  Name: Brenda Fox MRN: [id number] Date of Birth: 12/12/1990

## 2023-03-30 ENCOUNTER — Ambulatory Visit: Payer: Medicaid Other | Admitting: Occupational Therapy

## 2023-04-05 ENCOUNTER — Ambulatory Visit: Payer: Medicaid Other | Admitting: Occupational Therapy

## 2023-04-05 DIAGNOSIS — M79641 Pain in right hand: Secondary | ICD-10-CM

## 2023-04-05 DIAGNOSIS — M25641 Stiffness of right hand, not elsewhere classified: Secondary | ICD-10-CM

## 2023-04-05 DIAGNOSIS — M6281 Muscle weakness (generalized): Secondary | ICD-10-CM

## 2023-04-05 DIAGNOSIS — R600 Localized edema: Secondary | ICD-10-CM

## 2023-04-05 NOTE — Therapy (Signed)
[facility] [address], [name], [address] Phone: [phone]   Fax:  [phone]  Occupational Therapy Treatment  Patient Details  Name: Brenda Fox MRN: [id number] Date of Birth: 1991/05/21 Referring Provider (OT): Dr Landry Mellow   Encounter Date: 04/05/2023   OT End of Session - 04/05/23 0917     Visit Number 12    Number of Visits 16    Date for OT Re-Evaluation 05/15/23    OT Start Time 0900    OT Stop Time 0938    OT Time Calculation (min) 38 min    Activity Tolerance Patient tolerated treatment well    Behavior During Therapy [facility] for tasks assessed/performed             Past Medical History:  Diagnosis Date   Cancer of cervix [facility])     Past Surgical History:  Procedure Laterality Date   procedure for cervical cancer      There were no vitals filed for this visit.   Subjective Assessment - 04/05/23 0914     Subjective  I am using it about 75% of the time now but with the soft black splint - not sleeping with it - I did some cooking , cutting and even pulled my pants without the splint and did not had pain    Pertinent History 12/13/22 Ortho note- was seen again 01/05/23 and refer to OT -  /Brenda Fox is a 32 y.o. female that presents to clinic today for evaluation and management of chronic hand pain and swelling at the referral of Elvin So, PA (neurology).  At the time of this visit, I reviewed her most recent evaluation by neurology for ongoing right hand pain suspected to be due to mild carpal tunnel syndrome seen on EMG from 10/30/2022. She has previously been evaluated by [facility] orthopedics on 09/26/2022 where she was recommended hand therapy, naproxen, and EMG for her MRI findings of mild multiple finger flexor tenosynovitis. She has initial evaluation by a local urgent care on 12/23/2021 after acute onset hand pain and swelling from her hand getting caught conveyor belt at work. She was suspected to  have a hand strain treated with ibuprofen 800 mg.  Her pain began almost 1 year ago when her hand was caught between a conveyor belt and metal. Since that time, she has had evaluation in multiple offices including MRI of the hand, EMG, x-ray. She has had treatment with naproxen, nortriptyline, tramadol, acetaminophen, brace, physical therapy (pivot; mostly given home exercise) with ongoing symptoms. There was question if her pain was coming from her underlying mild carpal tunnel syndrome so she was recommended consultation by orthopedics. The pain is located over her dorsal hand between the second and third metacarpal. Her pain can be deep to this area. She is starting to notice pain radiating up her dorsal forearm toward her elbow. She describes her pain as constant and dull, throbbing, aching, sharp, shooting. It is aggravated by using her hand, doing her hair, doing her make-up, writing. After she does these activities, she will notice swelling along her dorsal hand. She currently rates pain severity as a 4/10. She reports associated swelling, pain at night. She denies associated finger locking/catching, numbness or tingling, weakness, fevers or chills, night sweats, weight loss, skin color change. She feels like putting some pressure in the area does initially her feel better.  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    Patient Stated  Goals Want my hand better -the pain , swelling and use so I can use it at home , work    Currently in Pain? Yes    Pain Score 1     Pain Location Hand    Pain Orientation Right    Pain Descriptors / Indicators Tightness;Tender    Pain Type Acute pain                [facility] OT Assessment - 04/05/23 0001       Strength   Right Hand Grip (lbs) 55    Right Hand 3 Point Pinch 20 lbs                      OT Treatments/Exercises (OP) - 04/05/23 0001       Ultrasound   Ultrasound Location volar and dorsal webspace    Ultrasound  Parameters 3.37mhz, 20%. 1.0 intensity    Ultrasound Goals Pain      RUE Contrast Bath   Time 8 minutes    Comments decrease pain            Patient arrive  with reports of pain still really good- about 1/10 - tenderness in webspace - over ADD pollicis Done some soft tissue massage for MC spreads and webspace - using graston on thenar eminence - pt doing at home massage   Trigger point release Using it more - about 75% Patient still continues to wear CMC neoprene during the day as well as thumb spica at nighttime.    Reviewed with patient again modifications and avoiding tight thumb lateral pinch and adduction. Strength in wrist 5/5 but fatigue.  Pain-free. Thumb palmar radial abduction today 5/5 pain-free With PA and RA AAROM and PROM /stretches Cont wall composite forearm and hand extensor stretch -10 x hold 5 sec - no pressure thru palm   Cont to increase use with CMC neoprene splint still on         OT Education - 04/05/23 0917     Education Details progress and changes to HEP and splint use    Person(s) Educated Patient    Methods Explanation;Demonstration;Tactile cues;Verbal cues;Handout    Comprehension Verbal cues required;Returned demonstration;Verbalized understanding                 OT Long Term Goals - 03/20/23 1155       OT LONG TERM GOAL #1   Title Pt to be ind in HEP to decrease pain/edema iwith AROM of digits, thumb and wrist to less than 2/10    Status Achieved      OT LONG TERM GOAL #2   Title Pt to show decrease edema and pain to less than 2/10 with bathing, dressing and grooming    Status Achieved      OT LONG TERM GOAL #3   Title Pain and edema improve in R hand and wrist to wean pt out of splints with ADL's    Baseline no pain for the last 4 days using CMC during day and night time prefab thumb spica    Time 6    Period Weeks    Status On-going    Target Date 05/01/23      OT LONG TERM GOAL #4   Title Pt verbalize 3  modifications to tasks to decrease edema and pain in ADL's and IADL's    Status Achieved      OT LONG TERM GOAL #5   Title R grip and  prehension strength increase to WNL for her age without increase symptoms to use hand more than 75%    Baseline grip 20 , lat 15 and 3 point 16 - pain 5/10  pain - pain increase to 8/10 tenderenss in thumb webspace - NOW no pain for 4 days- and prehension strength 19/17 lbs, no pain- Grip 42 but fear for pain- using hand less than 25 % in ADL"s and IADL's    Time 8    Period Weeks    Status On-going    Target Date 05/15/23                   Plan - 04/05/23 1610     Clinical Impression Statement Pt was seen by OT for hx of chronic hand pain and swelling since April 23 - when her hand got caught between 2 objects on conveyer belt - EMG on 10/30/22 showed mild carpal tunnel syndrome. MRI earlier this year showed mild multiple finger flexor tenosynovitis. She has had evaluation in multiple offices including MRI of the hand, EMG, x-ray. She has had treatment with naproxen, nortriptyline, tramadol, acetaminophen, brace, physical therapy (pivot; mostly given home exercise) with ongoing symptoms. Pt showed increase tenderness over thenar eminence,  and between 2nd and 3rd MC- 10/10 at eval.  NOW decrease pain for about 2 wks with only 1/10 with use in webspace  - still wearing CMC neoprene during day and thumb spica night time- using her hand in ADL's and IADL's about 75% - but normally on computer during day. Grip and prehension strenght increase greatly again this past week. AROM for wrist and thumb  WNL pain free.  Pt to cont with CMC neoprene splint during day and night time thumb spica. - Pt to increase functional use pain free to 100% over the next week. Cont at home with  modification to tasks to not increase symptoms.  Pt  cont to benefit frmo OT services to decrease pain and increase  ROM and strength with pain free functional use of R dominant hand in ADL's  and IADL's.    OT Occupational Profile and History Comprehensive Assessment- Review of records and extensive additional review of physical, cognitive, psychosocial history related to current functional performance    Occupational performance deficits (Please refer to evaluation for details): ADL's;IADL's;Leisure;Rest and Sleep;Work;Play;Social Participation    Body Structure / Function / Physical Skills ADL;Decreased knowledge of use of DME;Flexibility;ROM;UE functional use;Dexterity;Edema;Strength;Pain;IADL    Rehab Potential Good    Clinical Decision Making Several treatment options, min-mod task modification necessary    Comorbidities Affecting Occupational Performance: May have comorbidities impacting occupational performance    Modification or Assistance to Complete Evaluation  No modification of tasks or assist necessary to complete eval    OT Frequency 1x / week    OT Duration 8 weeks    OT Treatment/Interventions Self-care/ADL training;Cryotherapy;Ultrasound;Iontophoresis;Paraffin;Fluidtherapy;Contrast Bath;DME and/or AE instruction;Manual Therapy;Passive range of motion;Splinting;Patient/family education;Dry needling;Therapeutic exercise    Consulted and Agree with Plan of Care Patient             Patient will benefit from skilled therapeutic intervention in order to improve the following deficits and impairments:   Body Structure / Function / Physical Skills: ADL, Decreased knowledge of use of DME, Flexibility, ROM, UE functional use, Dexterity, Edema, Strength, Pain, IADL       Visit Diagnosis: Pain in right hand  Localized edema  Muscle weakness (generalized)  Stiffness of right hand, not elsewhere classified    Problem  List Patient Active Problem List   Diagnosis Date Noted   Indication for care in labor and delivery, antepartum 04/29/2018   Preterm uterine contractions in second trimester, antepartum 02/06/2018   Pregnancy 12/29/2017   Hyperemesis arising  during pregnancy 10/21/2017    Oletta Cohn, OTR/L,CLT 04/05/2023, 9:42 AM  [facility] [address] [name], [address] Phone: [phone]   Fax:  [phone]  Name: Deola Rewis MRN: [id number] Date of Birth: 23-Sep-1990

## 2023-04-10 ENCOUNTER — Ambulatory Visit: Payer: Medicaid Other | Admitting: Occupational Therapy

## 2023-04-10 DIAGNOSIS — M6281 Muscle weakness (generalized): Secondary | ICD-10-CM

## 2023-04-10 DIAGNOSIS — R600 Localized edema: Secondary | ICD-10-CM

## 2023-04-10 DIAGNOSIS — M79641 Pain in right hand: Secondary | ICD-10-CM | POA: Diagnosis not present

## 2023-04-10 DIAGNOSIS — M25641 Stiffness of right hand, not elsewhere classified: Secondary | ICD-10-CM

## 2023-04-10 NOTE — Therapy (Signed)
[facility] [address] [name], [address] Phone: [phone]   Fax:  [phone]  Occupational Therapy Treatment  Patient Details  Name: Brenda Fox MRN: [id number] Date of Birth: 1991-03-28 Referring Provider (OT): Dr Landry Mellow   Encounter Date: 04/10/2023   OT End of Session - 04/10/23 0949     Visit Number 13    Number of Visits 16    Date for OT Re-Evaluation 05/15/23    OT Start Time 0950    OT Stop Time 1030    OT Time Calculation (min) 40 min    Activity Tolerance Patient tolerated treatment well    Behavior During Therapy [facility] for tasks assessed/performed             Past Medical History:  Diagnosis Date   Cancer of cervix [facility])     Past Surgical History:  Procedure Laterality Date   procedure for cervical cancer      There were no vitals filed for this visit.   Subjective Assessment - 04/10/23 0949     Subjective  Used my hand 85% of the time- I even done more in the kitchen - cutting , stiring - picked up papertowels - and I could feel my thumb - done some massage and stretches - and that help    Pertinent History 12/13/22 Ortho note- was seen again 01/05/23 and refer to OT -  /[name] is a 32 y.o. female that presents to clinic today for evaluation and management of chronic hand pain and swelling at the referral of Elvin So, PA (neurology).  At the time of this visit, I reviewed her most recent evaluation by neurology for ongoing right hand pain suspected to be due to mild carpal tunnel syndrome seen on EMG from 10/30/2022. She has previously been evaluated by [facility] orthopedics on 09/26/2022 where she was recommended hand therapy, naproxen, and EMG for her MRI findings of mild multiple finger flexor tenosynovitis. She has initial evaluation by a local urgent care on 12/23/2021 after acute onset hand pain and swelling from her hand getting caught conveyor belt at work. She was suspected to have a  hand strain treated with ibuprofen 800 mg.  Her pain began almost 1 year ago when her hand was caught between a conveyor belt and metal. Since that time, she has had evaluation in multiple offices including MRI of the hand, EMG, x-ray. She has had treatment with naproxen, nortriptyline, tramadol, acetaminophen, brace, physical therapy (pivot; mostly given home exercise) with ongoing symptoms. There was question if her pain was coming from her underlying mild carpal tunnel syndrome so she was recommended consultation by orthopedics. The pain is located over her dorsal hand between the second and third metacarpal. Her pain can be deep to this area. She is starting to notice pain radiating up her dorsal forearm toward her elbow. She describes her pain as constant and dull, throbbing, aching, sharp, shooting. It is aggravated by using her hand, doing her hair, doing her make-up, writing. After she does these activities, she will notice swelling along her dorsal hand. She currently rates pain severity as a 4/10. She reports associated swelling, pain at night. She denies associated finger locking/catching, numbness or tingling, weakness, fevers or chills, night sweats, weight loss, skin color change. She feels like putting some pressure in the area does initially her feel better.  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    Patient Stated Goals Want  my hand better -the pain , swelling and use so I can use it at home , work    Currently in Pain? No/denies                [facility] OT Assessment - 04/10/23 0001       Strength   Right Hand Grip (lbs) 58    Right Hand Lateral Pinch 19 lbs    Right Hand 3 Point Pinch 20 lbs    Left Hand Grip (lbs) 70    Left Hand Lateral Pinch 19 lbs    Left Hand 3 Point Pinch 18 lbs              Patient arrive with reports of increased use  of R hand to  85% as well as wearing neoprene CMC during the day about 75%-not sleeping with any brace Patient  report using it more in the kitchen this past week as well as around the house. Did pick up a pack of paper towels where she felt some discomfort but was doing massages and stretches and felt much better Reviewed with patient again about modifying her grip. Avoiding any overuse or static lateral pinch using adductor pollicis Better with using larger joints or palms, and large grips, 90 degree grip instead of lateral diagonal grip        OT Treatments/Exercises (OP) - 04/10/23 0001       RUE Contrast Bath   Time 8 minutes    Comments decrease tightnes , prior to soft tissue and stretches             No pain or tenderness in the webspace or with palmar radial abduction of the thumb  done some soft tissue massage for MC spreads and webspace - using graston radial , volar and dorsal wrist and forearm- sweeping and brushing - tool nr 2 - as well as on thenar eminence - pt doing at home massage   Trigger point release      Reviewed with patient again modifications and avoiding tight thumb lateral pinch and adduction. Strength in wrist 5/5.  Pain-free. Thumb palmar radial abduction today 5/5 pain-free With PA and RA AAROM and PROM /stretches Cont wall composite forearm and hand extensor stretch -10 x hold 5 sec - no pressure thru palm    Continue to increase use of right dominant hand around the house as well as weaning gradually out of CMC neoprene.           OT Education - 04/10/23 0949     Education Details progress and changes to HEP and splint use    Person(s) Educated Patient    Methods Explanation;Demonstration;Tactile cues;Verbal cues;Handout    Comprehension Verbal cues required;Returned demonstration;Verbalized understanding                 OT Long Term Goals - 03/20/23 1155       OT LONG TERM GOAL #1   Title Pt to be ind in HEP to decrease pain/edema iwith AROM of digits, thumb and wrist to less than 2/10    Status Achieved      OT LONG TERM  GOAL #2   Title Pt to show decrease edema and pain to less than 2/10 with bathing, dressing and grooming    Status Achieved      OT LONG TERM GOAL #3   Title Pain and edema improve in R hand and wrist to wean pt out of splints with ADL's  Baseline no pain for the last 4 days using CMC during day and night time prefab thumb spica    Time 6    Period Weeks    Status On-going    Target Date 05/01/23      OT LONG TERM GOAL #4   Title Pt verbalize 3 modifications to tasks to decrease edema and pain in ADL's and IADL's    Status Achieved      OT LONG TERM GOAL #5   Title R grip and prehension strength increase to WNL for her age without increase symptoms to use hand more than 75%    Baseline grip 20 , lat 15 and 3 point 16 - pain 5/10  pain - pain increase to 8/10 tenderenss in thumb webspace - NOW no pain for 4 days- and prehension strength 19/17 lbs, no pain- Grip 42 but fear for pain- using hand less than 25 % in ADL"s and IADL's    Time 8    Period Weeks    Status On-going    Target Date 05/15/23                   Plan - 04/10/23 0950     Clinical Impression Statement Pt was seen by OT for hx of chronic hand pain and swelling since April 23 - when her hand got caught between 2 objects on conveyer belt - EMG on 10/30/22 showed mild carpal tunnel syndrome. MRI earlier this year showed mild multiple finger flexor tenosynovitis. She has had evaluation in multiple offices including MRI of the hand, EMG, x-ray. She has had treatment with naproxen, nortriptyline, tramadol, acetaminophen, brace, physical therapy (pivot; mostly given home exercise) with ongoing symptoms. Pt showed increase tenderness over thenar eminence,  and between 2nd and 3rd MC- 10/10 at eval.  NOW decrease pain for about 3 wks with only 1/10 tenderness at the worse in webspace  - still wearing CMC neoprene during day but about 75% now -no splint night time.  Increase use of her hand in ADL's and IADL's about 85% -  but normally on computer during day. Grip and prehension strenght cont to increase with less pain.   Pt to cont with CMC neoprene splint during day but can decrease to less than 50% . Pt to increase functional use pain free to 100% over the next few wks and wean out of splint without increasing symptoms. Cont at home with  modification to tasks to not increase symptoms.  Pt  cont to benefit frmo OT services to decrease pain and increase  ROM and strength with pain free functional use of R dominant hand in ADL's and IADL's.    OT Occupational Profile and History Comprehensive Assessment- Review of records and extensive additional review of physical, cognitive, psychosocial history related to current functional performance    Occupational performance deficits (Please refer to evaluation for details): ADL's;IADL's;Leisure;Rest and Sleep;Work;Play;Social Participation    Body Structure / Function / Physical Skills ADL;Decreased knowledge of use of DME;Flexibility;ROM;UE functional use;Dexterity;Edema;Strength;Pain;IADL    Rehab Potential Good    Clinical Decision Making Several treatment options, min-mod task modification necessary    Comorbidities Affecting Occupational Performance: May have comorbidities impacting occupational performance    Modification or Assistance to Complete Evaluation  No modification of tasks or assist necessary to complete eval    OT Frequency Biweekly    OT Duration 8 weeks    OT Treatment/Interventions Self-care/ADL training;Cryotherapy;Ultrasound;Iontophoresis;Paraffin;Fluidtherapy;Contrast Bath;DME and/or AE instruction;Manual Therapy;Passive range of motion;Splinting;Patient/family education;Dry needling;Therapeutic  exercise    Consulted and Agree with Plan of Care Patient             Patient will benefit from skilled therapeutic intervention in order to improve the following deficits and impairments:   Body Structure / Function / Physical Skills: ADL, Decreased  knowledge of use of DME, Flexibility, ROM, UE functional use, Dexterity, Edema, Strength, Pain, IADL       Visit Diagnosis: Pain in right hand  Localized edema  Muscle weakness (generalized)  Stiffness of right hand, not elsewhere classified    Problem List Patient Active Problem List   Diagnosis Date Noted   Indication for care in labor and delivery, antepartum 04/29/2018   Preterm uterine contractions in second trimester, antepartum 02/06/2018   Pregnancy 12/29/2017   Hyperemesis arising during pregnancy 10/21/2017    Oletta Cohn, OTR/L,CLT 04/10/2023, 11:44 AM  [facility] [address] [name], [address] Phone: [phone]   Fax:  [phone]  Name: Halo Shevlin MRN: [id number] Date of Birth: May 30, 1991

## 2023-04-17 ENCOUNTER — Encounter: Payer: Medicaid Other | Admitting: Occupational Therapy

## 2023-04-23 ENCOUNTER — Ambulatory Visit: Payer: Medicaid Other | Attending: Sports Medicine | Admitting: Occupational Therapy

## 2023-04-23 DIAGNOSIS — M6281 Muscle weakness (generalized): Secondary | ICD-10-CM | POA: Diagnosis present

## 2023-04-23 DIAGNOSIS — R600 Localized edema: Secondary | ICD-10-CM | POA: Insufficient documentation

## 2023-04-23 DIAGNOSIS — M25641 Stiffness of right hand, not elsewhere classified: Secondary | ICD-10-CM | POA: Insufficient documentation

## 2023-04-23 DIAGNOSIS — M79641 Pain in right hand: Secondary | ICD-10-CM | POA: Insufficient documentation

## 2023-04-23 NOTE — Therapy (Signed)
[facility] [address], [name], [address] Phone: [phone]   Fax:  [phone]  Occupational Therapy Treatment  Patient Details  Name: Brenda Fox MRN: [id number] Date of Birth: 1991-08-16 Referring Provider (OT): Dr Landry Mellow   Encounter Date: 04/23/2023   OT End of Session - 04/23/23 1300     Visit Number 14    Number of Visits 16    Date for OT Re-Evaluation 05/15/23    OT Start Time 0904    OT Stop Time 0945    OT Time Calculation (min) 41 min    Activity Tolerance Patient tolerated treatment well    Behavior During Therapy [facility] for tasks assessed/performed             Past Medical History:  Diagnosis Date   Cancer of cervix [facility])     Past Surgical History:  Procedure Laterality Date   procedure for cervical cancer      There were no vitals filed for this visit.   Subjective Assessment - 04/23/23 1258     Subjective  I was doing great for the 2 weeks.  Until after the end of the week when I was cleaning and taking the trash out.  I was using my hand about 100%.  I started having some swelling and pain in my thumb webspace.  So I stopped and used my hot and cold and my massage got better.  I feel so much better    Pertinent History 12/13/22 Ortho note- was seen again 01/05/23 and refer to OT -  /Brenda Fox is a 32 y.o. female that presents to clinic today for evaluation and management of chronic hand pain and swelling at the referral of Elvin So, PA (neurology).  At the time of this visit, I reviewed her most recent evaluation by neurology for ongoing right hand pain suspected to be due to mild carpal tunnel syndrome seen on EMG from 10/30/2022. She has previously been evaluated by [facility] orthopedics on 09/26/2022 where she was recommended hand therapy, naproxen, and EMG for her MRI findings of mild multiple finger flexor tenosynovitis. She has initial evaluation by a local urgent care on 12/23/2021 after  acute onset hand pain and swelling from her hand getting caught conveyor belt at work. She was suspected to have a hand strain treated with ibuprofen 800 mg.  Her pain began almost 1 year ago when her hand was caught between a conveyor belt and metal. Since that time, she has had evaluation in multiple offices including MRI of the hand, EMG, x-ray. She has had treatment with naproxen, nortriptyline, tramadol, acetaminophen, brace, physical therapy (pivot; mostly given home exercise) with ongoing symptoms. There was question if her pain was coming from her underlying mild carpal tunnel syndrome so she was recommended consultation by orthopedics. The pain is located over her dorsal hand between the second and third metacarpal. Her pain can be deep to this area. She is starting to notice pain radiating up her dorsal forearm toward her elbow. She describes her pain as constant and dull, throbbing, aching, sharp, shooting. It is aggravated by using her hand, doing her hair, doing her make-up, writing. After she does these activities, she will notice swelling along her dorsal hand. She currently rates pain severity as a 4/10. She reports associated swelling, pain at night. She denies associated finger locking/catching, numbness or tingling, weakness, fevers or chills, night sweats, weight loss, skin color change. She feels like putting some  pressure in the area does initially her feel better.  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    Patient Stated Goals Want my hand better -the pain , swelling and use so I can use it at home , work    Currently in Pain? No/denies                [facility] OT Assessment - 04/23/23 0001       Strength   Right Hand Grip (lbs) 80    Right Hand Lateral Pinch 20 lbs    Right Hand 3 Point Pinch 22 lbs    Left Hand Grip (lbs) 75    Left Hand Lateral Pinch 20 lbs    Left Hand 3 Point Pinch 20 lbs             Patient arrive with reports of increased use   of R hand to  100% as well as wearing neoprene CMC during the day about 50%-not sleeping with any brace Patient report using  with no issues until she was cleaning and taking out the trash -and had some edema ,pain in webspace to thumb - DONE hot and cold ,  massages and stretches and felt much better Reviewed with patient again about modifying her grip.and keeping weight still under 8 lbs  Avoiding any overuse or static lateral pinch using adductor pollicis Better with using larger joints or palms, and large grips, 90 degree grip instead of lateral diagonal grip                        OT Treatments/Exercises (OP) - 04/23/23 0001       Ultrasound   Ultrasound Location volar and dorsal webspace of thumb    Ultrasound Parameters 3.53mhz, 20%, 1.0 intensity    Ultrasound Goals Pain      RUE Contrast Bath   Time 8 minutes    Comments decrease tightness           No pain or tenderness in the webspace or with palmar radial abduction of the thumb  done some soft tissue massage for MC spreads and webspace - using graston radial , volar and dorsal wrist and forearm- sweeping and brushing - tool nr 2 - as well as on thenar eminence - pt doing at home massage   Trigger point release in webspace and done [place] at end of session    Strength in wrist 5/5.  Pain-free. Thumb palmar radial abduction today 5/5 pain-free With PA and RA AAROM and PROM /stretches Cont wall composite forearm and hand extensor stretch -10 x hold 5 sec - no pressure thru palm     Continue to increase use of right dominant hand around the house as well as weaning gradually out of CMC neoprene. 2-3 hrs off and 1 hour on       OT Education - 04/23/23 1300     Education Details progress and changes to HEP and splint use    Person(s) Educated Patient    Comprehension Verbal cues required;Returned demonstration;Verbalized understanding                 OT Long Term Goals - 03/20/23 1155       OT LONG  TERM GOAL #1   Title Pt to be ind in HEP to decrease pain/edema iwith AROM of digits, thumb and wrist to less than 2/10    Status Achieved      OT  LONG TERM GOAL #2   Title Pt to show decrease edema and pain to less than 2/10 with bathing, dressing and grooming    Status Achieved      OT LONG TERM GOAL #3   Title Pain and edema improve in R hand and wrist to wean pt out of splints with ADL's    Baseline no pain for the last 4 days using CMC during day and night time prefab thumb spica    Time 6    Period Weeks    Status On-going    Target Date 05/01/23      OT LONG TERM GOAL #4   Title Pt verbalize 3 modifications to tasks to decrease edema and pain in ADL's and IADL's    Status Achieved      OT LONG TERM GOAL #5   Title R grip and prehension strength increase to WNL for her age without increase symptoms to use hand more than 75%    Baseline grip 20 , lat 15 and 3 point 16 - pain 5/10  pain - pain increase to 8/10 tenderenss in thumb webspace - NOW no pain for 4 days- and prehension strength 19/17 lbs, no pain- Grip 42 but fear for pain- using hand less than 25 % in ADL"s and IADL's    Time 8    Period Weeks    Status On-going    Target Date 05/15/23                   Plan - 04/23/23 1300     Clinical Impression Statement Pt was seen by OT for hx of chronic hand pain and swelling since April 23 - when her hand got caught between 2 objects on conveyer belt - EMG on 10/30/22 showed mild carpal tunnel syndrome. MRI earlier this year showed mild multiple finger flexor tenosynovitis. She has had evaluation in multiple offices including MRI of the hand, EMG, x-ray. She has had treatment with naproxen, nortriptyline, tramadol, acetaminophen, brace, physical therapy (pivot; mostly given home exercise) with ongoing symptoms. Pt showed increase tenderness over thenar eminence,  and between 2nd and 3rd MC- 10/10 at eval.  NOW  Pt arrive after using her hand reported about 100% and  neoprene black splint barely - she done great but had some swelling and pain less than 2-3/10 after cleaning and taking trash out. Today graet progress in grip and prehension- tenderness in thumb webspace 1/10 - AROM WNL . Wrist AROM WNL and strength WNL. Pt to increase functional use pain free to 100% over the next few wks but keep weight under 8lbs- and wean out of splint without increasing symptoms 2 -3 hrs off and 1 hour on. Cont at home with  modification to tasks to not increase symptoms.  Pt  cont to benefit frmo OT services to decrease pain and increase  ROM and strength with pain free functional use of R dominant hand in ADL's and IADL's.    OT Occupational Profile and History Comprehensive Assessment- Review of records and extensive additional review of physical, cognitive, psychosocial history related to current functional performance    Occupational performance deficits (Please refer to evaluation for details): ADL's;IADL's;Leisure;Rest and Sleep;Work;Play;Social Participation    Body Structure / Function / Physical Skills ADL;Decreased knowledge of use of DME;Flexibility;ROM;UE functional use;Dexterity;Edema;Strength;Pain;IADL    Rehab Potential Good    Clinical Decision Making Several treatment options, min-mod task modification necessary    Comorbidities Affecting Occupational Performance: May have comorbidities impacting occupational  performance    Modification or Assistance to Complete Evaluation  No modification of tasks or assist necessary to complete eval    OT Frequency Biweekly    OT Duration 8 weeks    OT Treatment/Interventions Self-care/ADL training;Cryotherapy;Ultrasound;Iontophoresis;Paraffin;Fluidtherapy;Contrast Bath;DME and/or AE instruction;Manual Therapy;Passive range of motion;Splinting;Patient/family education;Dry needling;Therapeutic exercise    Consulted and Agree with Plan of Care Patient             Patient will benefit from skilled therapeutic intervention in  order to improve the following deficits and impairments:   Body Structure / Function / Physical Skills: ADL, Decreased knowledge of use of DME, Flexibility, ROM, UE functional use, Dexterity, Edema, Strength, Pain, IADL       Visit Diagnosis: Pain in right hand  Localized edema  Muscle weakness (generalized)  Stiffness of right hand, not elsewhere classified    Problem List Patient Active Problem List   Diagnosis Date Noted   Indication for care in labor and delivery, antepartum 04/29/2018   Preterm uterine contractions in second trimester, antepartum 02/06/2018   Pregnancy 12/29/2017   Hyperemesis arising during pregnancy 10/21/2017    Oletta Cohn, OTR/L,CLT 04/23/2023, 1:04 PM  [facility] [place] [facility] [address], [name], [address] Phone: [phone]   Fax:  [phone]  Name: Develyn Bjelland MRN: [id number] Date of Birth: 1991/06/20

## 2023-04-24 ENCOUNTER — Encounter: Payer: Medicaid Other | Admitting: Occupational Therapy

## 2023-05-08 ENCOUNTER — Ambulatory Visit: Payer: Medicaid Other | Admitting: Occupational Therapy

## 2023-05-08 DIAGNOSIS — M79641 Pain in right hand: Secondary | ICD-10-CM | POA: Diagnosis not present

## 2023-05-08 DIAGNOSIS — R600 Localized edema: Secondary | ICD-10-CM

## 2023-05-08 DIAGNOSIS — M25641 Stiffness of right hand, not elsewhere classified: Secondary | ICD-10-CM

## 2023-05-08 NOTE — Therapy (Signed)
[facility] [address], [name], [address] Phone: [phone]   Fax:  [phone]  Occupational Therapy Treatment  Patient Details  Name: Brenda Fox MRN: [id number] Date of Birth: 1991-04-05 Referring Provider (OT): Dr Landry Mellow   Encounter Date: 05/08/2023   OT End of Session - 05/08/23 1229     Visit Number 15    Number of Visits 16    Date for OT Re-Evaluation 05/15/23    OT Start Time 1115    OT Stop Time 1145    OT Time Calculation (min) 30 min    Activity Tolerance Patient tolerated treatment well    Behavior During Therapy [facility] for tasks assessed/performed             Past Medical History:  Diagnosis Date   Cancer of cervix [facility])     Past Surgical History:  Procedure Laterality Date   procedure for cervical cancer      There were no vitals filed for this visit.   Subjective Assessment - 05/08/23 1225     Subjective  I had some tightness only about 2-3 x since I seen you - driving to [facility] and scrubbing the tub - but then I do the massage and the heat and feels better- using my hand 100% just modify my grip    Pertinent History 12/13/22 Ortho note- was seen again 01/05/23 and refer to OT -  /Brenda Fox is a 32 y.o. female that presents to clinic today for evaluation and management of chronic hand pain and swelling at the referral of Elvin So, PA (neurology).  At the time of this visit, I reviewed her most recent evaluation by neurology for ongoing right hand pain suspected to be due to mild carpal tunnel syndrome seen on EMG from 10/30/2022. She has previously been evaluated by [facility] orthopedics on 09/26/2022 where she was recommended hand therapy, naproxen, and EMG for her MRI findings of mild multiple finger flexor tenosynovitis. She has initial evaluation by a local urgent care on 12/23/2021 after acute onset hand pain and swelling from her hand getting caught conveyor belt at work. She was suspected to  have a hand strain treated with ibuprofen 800 mg.  Her pain began almost 1 year ago when her hand was caught between a conveyor belt and metal. Since that time, she has had evaluation in multiple offices including MRI of the hand, EMG, x-ray. She has had treatment with naproxen, nortriptyline, tramadol, acetaminophen, brace, physical therapy (pivot; mostly given home exercise) with ongoing symptoms. There was question if her pain was coming from her underlying mild carpal tunnel syndrome so she was recommended consultation by orthopedics. The pain is located over her dorsal hand between the second and third metacarpal. Her pain can be deep to this area. She is starting to notice pain radiating up her dorsal forearm toward her elbow. She describes her pain as constant and dull, throbbing, aching, sharp, shooting. It is aggravated by using her hand, doing her hair, doing her make-up, writing. After she does these activities, she will notice swelling along her dorsal hand. She currently rates pain severity as a 4/10. She reports associated swelling, pain at night. She denies associated finger locking/catching, numbness or tingling, weakness, fevers or chills, night sweats, weight loss, skin color change. She feels like putting some pressure in the area does initially her feel better.  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    Patient Stated  Goals Want my hand better -the pain , swelling and use so I can use it at home , work    Currently in Pain? No/denies                [facility] OT Assessment - 05/08/23 0001       Strength   Right Hand Grip (lbs) 75    Right Hand Lateral Pinch 21 lbs    Right Hand 3 Point Pinch 21 lbs    Left Hand Grip (lbs) 65    Left Hand Lateral Pinch 20 lbs    Left Hand 3 Point Pinch 19 lbs      Right Hand AROM   R Thumb Radial ABduction/ADduction 0-55 60    R Thumb Palmar ABduction/ADduction 0-45 60    R Thumb Opposition to Index --   WNL - strenght 5/5  pain free               Patient arrived after not being seen for 3 weeks.  Reports only time thumb bothers her was driving to [place], scrubbing the bathtub.  Otherwise when she feels thumb she does some light massage with some stretches.  Trying to use her hand normally but do adjust or adapt still using palm or forearm to pick up and carry objects      OT Treatments/Exercises (OP) - 05/08/23 0001       Moist Heat Therapy   Number Minutes Moist Heat 6 Minutes    Moist Heat Location Hand      Ultrasound   Ultrasound Location volar and dorsal webspace    Ultrasound Parameters 3.56mhz, 20%, 1.0 intensity    Ultrasound Goals Pain            Tenderness in the webspace 1/10 No pain or tenderness with  palmar radial abduction of the thumb  done some soft tissue massage for MC spreads and webspace - using graston radial , volar and dorsal wrist and forearm- sweeping and brushing - tool nr 2 - as well as on thenar eminence - pt doing at home massage   Trigger point release in webspace and done [place] at end of session      Strength in wrist 5/5.  Pain-free. Thumb palmar radial abduction today 5/5 pain-free With PA and RA AAROM and PROM /stretches     Patient reported using her hand 100% of the time but modifying.   Using soft neoprene CMC as needed.   Patient following up in 3 weeks.           OT Education - 05/08/23 1229     Education Details progress and changes to HEP and splint use    Person(s) Educated Patient    Methods Explanation;Demonstration;Tactile cues;Verbal cues;Handout    Comprehension Verbal cues required;Returned demonstration;Verbalized understanding                 OT Long Term Goals - 03/20/23 1155       OT LONG TERM GOAL #1   Title Pt to be ind in HEP to decrease pain/edema iwith AROM of digits, thumb and wrist to less than 2/10    Status Achieved      OT LONG TERM GOAL #2   Title Pt to show decrease edema and pain to less than 2/10  with bathing, dressing and grooming    Status Achieved      OT LONG TERM GOAL #3   Title Pain and edema improve in R hand and wrist  to wean pt out of splints with ADL's    Baseline no pain for the last 4 days using CMC during day and night time prefab thumb spica    Time 6    Period Weeks    Status On-going    Target Date 05/01/23      OT LONG TERM GOAL #4   Title Pt verbalize 3 modifications to tasks to decrease edema and pain in ADL's and IADL's    Status Achieved      OT LONG TERM GOAL #5   Title R grip and prehension strength increase to WNL for her age without increase symptoms to use hand more than 75%    Baseline grip 20 , lat 15 and 3 point 16 - pain 5/10  pain - pain increase to 8/10 tenderenss in thumb webspace - NOW no pain for 4 days- and prehension strength 19/17 lbs, no pain- Grip 42 but fear for pain- using hand less than 25 % in ADL"s and IADL's    Time 8    Period Weeks    Status On-going    Target Date 05/15/23                   Plan - 05/08/23 1230     OT Occupational Profile and History Comprehensive Assessment- Review of records and extensive additional review of physical, cognitive, psychosocial history related to current functional performance    Occupational performance deficits (Please refer to evaluation for details): ADL's;IADL's;Leisure;Rest and Sleep;Work;Play;Social Participation    Body Structure / Function / Physical Skills ADL;Decreased knowledge of use of DME;Flexibility;ROM;UE functional use;Dexterity;Edema;Strength;Pain;IADL    Rehab Potential Good    Clinical Decision Making Several treatment options, min-mod task modification necessary    Comorbidities Affecting Occupational Performance: May have comorbidities impacting occupational performance    Modification or Assistance to Complete Evaluation  No modification of tasks or assist necessary to complete eval    OT Frequency --   3 wks   OT Duration 4 weeks    OT  Treatment/Interventions Self-care/ADL training;Cryotherapy;Ultrasound;Iontophoresis;Paraffin;Fluidtherapy;Contrast Bath;DME and/or AE instruction;Manual Therapy;Passive range of motion;Splinting;Patient/family education;Dry needling;Therapeutic exercise    Consulted and Agree with Plan of Care Patient             Patient will benefit from skilled therapeutic intervention in order to improve the following deficits and impairments:   Body Structure / Function / Physical Skills: ADL, Decreased knowledge of use of DME, Flexibility, ROM, UE functional use, Dexterity, Edema, Strength, Pain, IADL       Visit Diagnosis: Pain in right hand  Localized edema  Stiffness of right hand, not elsewhere classified    Problem List Patient Active Problem List   Diagnosis Date Noted   Indication for care in labor and delivery, antepartum 04/29/2018   Preterm uterine contractions in second trimester, antepartum 02/06/2018   Pregnancy 12/29/2017   Hyperemesis arising during pregnancy 10/21/2017    Oletta Cohn, OTR/L,CLT 05/08/2023, 12:37 PM  [facility] [address], [name], [address] Phone: [phone]   Fax:  [phone]  Name: Weena Eilertson MRN: [id number] Date of Birth: [birth year]

## 2023-05-25 ENCOUNTER — Ambulatory Visit: Payer: Medicaid Other | Admitting: Occupational Therapy

## 2023-05-29 ENCOUNTER — Ambulatory Visit: Payer: Medicaid Other | Admitting: Occupational Therapy

## 2023-07-03 ENCOUNTER — Ambulatory Visit: Payer: Medicaid Other | Attending: Sports Medicine | Admitting: Occupational Therapy

## 2023-07-16 ENCOUNTER — Emergency Department
Admission: EM | Admit: 2023-07-16 | Discharge: 2023-07-16 | Disposition: A | Discharge status: Home / Self Care | Payer: Medicaid Other | Attending: Emergency Medicine | Admitting: Emergency Medicine

## 2023-07-16 ENCOUNTER — Other Ambulatory Visit: Payer: Self-pay

## 2023-07-16 ENCOUNTER — Encounter: Payer: Self-pay | Admitting: Emergency Medicine

## 2023-07-16 DIAGNOSIS — R519 Headache, unspecified: Secondary | ICD-10-CM | POA: Diagnosis present

## 2023-07-16 DIAGNOSIS — G43109 Migraine with aura, not intractable, without status migrainosus: Secondary | ICD-10-CM | POA: Insufficient documentation

## 2023-07-16 LAB — CBC WITH DIFFERENTIAL/PLATELET
Abs Immature Granulocytes: 0.03 10*3/uL (ref 0.00–0.07)
Basophils Absolute: 0.1 10*3/uL (ref 0.0–0.1)
Basophils Relative: 1 %
Eosinophils Absolute: 0.8 10*3/uL — ABNORMAL HIGH (ref 0.0–0.5)
Eosinophils Relative: 7 %
HCT: 41.7 % (ref 36.0–46.0)
Hemoglobin: 13.7 g/dL (ref 12.0–15.0)
Immature Granulocytes: 0 %
Lymphocytes Relative: 26 %
Lymphs Abs: 2.7 10*3/uL (ref 0.7–4.0)
MCH: 27 pg (ref 26.0–34.0)
MCHC: 32.9 g/dL (ref 30.0–36.0)
MCV: 82.2 fL (ref 80.0–100.0)
Monocytes Absolute: 0.8 10*3/uL (ref 0.1–1.0)
Monocytes Relative: 8 %
Neutro Abs: 6.1 10*3/uL (ref 1.7–7.7)
Neutrophils Relative %: 58 %
Platelets: 374 10*3/uL (ref 150–400)
RBC: 5.07 MIL/uL (ref 3.87–5.11)
RDW: 13.2 % (ref 11.5–15.5)
WBC: 10.5 10*3/uL (ref 4.0–10.5)
nRBC: 0 % (ref 0.0–0.2)

## 2023-07-16 LAB — BASIC METABOLIC PANEL
Anion gap: 8 (ref 5–15)
BUN: 17 mg/dL (ref 6–20)
CO2: 25 mmol/L (ref 22–32)
Calcium: 8.8 mg/dL — ABNORMAL LOW (ref 8.9–10.3)
Chloride: 102 mmol/L (ref 98–111)
Creatinine, Ser: 0.74 mg/dL (ref 0.44–1.00)
GFR, Estimated: >60 mL/min (ref >60)
Glucose, Bld: 92 mg/dL (ref 70–99)
Potassium: 3.5 mmol/L (ref 3.5–5.1)
Sodium: 135 mmol/L (ref 135–145)

## 2023-07-16 MED ORDER — ACETAMINOPHEN 500 MG PO TABS
1000.0000 mg | ORAL_TABLET | Freq: Once | ORAL | Status: AC
  Administered 2023-07-16: 1000 mg via ORAL
  Filled 2023-07-16: qty 2

## 2023-07-16 MED ORDER — IBUPROFEN 400 MG PO TABS
400.0000 mg | ORAL_TABLET | Freq: Once | ORAL | Status: AC
  Administered 2023-07-16: 400 mg via ORAL
  Filled 2023-07-16: qty 1

## 2023-07-16 NOTE — ED Provider Notes (Signed)
[facility] Provider Note    Event Date/Time   First MD Initiated Contact with Patient 07/16/23 2102     (approximate)  History   Chief Complaint: Eye Problem  HPI  Brenda Fox is a 32 y.o. female with no significant past medical history who presents to the emergency department for visual disturbance.  According to the patient she was at home when she began experiencing a fuzzy blurred vision to both of her eyes.  Patient states it lasted for approximately 20 to 30 minutes and then resolved.  She states approximately 30 minutes later she developed a significant headache which she describes as a 7/10 along with nausea photo and phonophobia.  Patient denies any history of migraines in the past however I did review the patient's chart and she has been to the emergency department for headaches previously.  Patient denies any numbness or weakness of any arm or leg.  Patient states her vision returned back to baseline once the headache had started.  She continues to have a headache which she states is improved however currently a 5/10 in severity.  Physical Exam   Triage Vital Signs: ED Triage Vitals  Encounter Vitals Group     BP 07/16/23 1650 119/83     Systolic BP Percentile --      Diastolic BP Percentile --      Pulse Rate 07/16/23 1650 81     Resp 07/16/23 1650 18     Temp 07/16/23 1650 98.8 F (37.1 C)     Temp Source 07/16/23 1650 Oral     SpO2 07/16/23 1650 100 %     Weight 07/16/23 1644 200 lb (90.7 kg)     Height 07/16/23 1644 5\' 5"  (1.651 m)     Head Circumference --      Peak Flow --      Pain Score 07/16/23 1643 3     Pain Loc --      Pain Education --      Exclude from Growth Chart --     Most recent vital signs: Vitals:   07/16/23 1650 07/16/23 2100  BP: 119/83 106/64  Pulse: 81 73  Resp: 18 17  Temp: 98.8 F (37.1 C) 98 F (36.7 C)  SpO2: 100% 100%    General: Awake, no distress.  Extraocular muscles intact, pupils 3 mm equal  round reactive to light. CV:  Good peripheral perfusion.  Regular rate and rhythm  Resp:  Normal effort.  Equal breath sounds bilaterally.  Abd:  No distention.  Soft, nontender.  No rebound or guarding.   ED Results / Procedures / Treatments   MEDICATIONS ORDERED IN ED: Medications  acetaminophen (TYLENOL) tablet 1,000 mg (has no administration in time range)  ibuprofen (ADVIL) tablet 400 mg (has no administration in time range)     IMPRESSION / MDM / ASSESSMENT AND PLAN / ED COURSE  I reviewed the triage vital signs and the nursing notes.  Patient's presentation is most consistent with acute presentation with potential threat to life or bodily function.  Patient presents the emergency department for visual disturbance followed by headache.  Patient symptoms are very suggestive of an ocular migraine with aura.  Patient states she saw a fuzziness to her vision mostly in the upper vision on both eyes lasting approximately 20 to 30 minutes before resolving.  She states approximately 20 or 30 minutes after that developed a significant headache along with nausea photo and phonophobia.  She states the  headache is improving and the nausea and photo/phonophobia have largely resolved.  Patient states her vision is normal.  Symptoms are very suggestive of migraine with aura.  FINAL CLINICAL IMPRESSION(S) / ED DIAGNOSES   Ocular migraine Migraine with aura   Note:  This document was prepared using Dragon voice recognition software and may include unintentional dictation errors.   Minna Antis, MD 07/16/23 2133

## 2023-07-16 NOTE — Discharge Instructions (Signed)
Please take Tylenol or ibuprofen every 6 hours as needed for discomfort/headache.  Return to the emergency department for any further visual disturbance or any other symptom personally concerning to yourself.  Otherwise please follow-up with your primary care doctor in the next 2 to 3 days for recheck/reevaluation.

## 2023-07-16 NOTE — ED Triage Notes (Signed)
Patient to ED via POV for burred vision in both eyes. Patient stated this started approx 40 minutes ago while watching TV. Also having a headache behind eyes. Equal strength/sensation in all extremities. Patient crying to triage and shaking due to being "nervous."   Spoke with Ray, MD- no code stroke at this time.

## 2023-10-21 ENCOUNTER — Other Ambulatory Visit: Payer: Self-pay

## 2023-10-21 ENCOUNTER — Encounter: Payer: Self-pay | Admitting: Emergency Medicine

## 2023-10-21 ENCOUNTER — Emergency Department
Admission: EM | Admit: 2023-10-21 | Discharge: 2023-10-21 | Disposition: A | Discharge status: Home / Self Care | Payer: Medicaid Other | Attending: Emergency Medicine | Admitting: Emergency Medicine

## 2023-10-21 DIAGNOSIS — R059 Cough, unspecified: Secondary | ICD-10-CM | POA: Diagnosis present

## 2023-10-21 DIAGNOSIS — J101 Influenza due to other identified influenza virus with other respiratory manifestations: Secondary | ICD-10-CM | POA: Diagnosis not present

## 2023-10-21 DIAGNOSIS — Z20822 Contact with and (suspected) exposure to covid-19: Secondary | ICD-10-CM | POA: Diagnosis not present

## 2023-10-21 LAB — RESP PANEL BY RT-PCR (RSV, FLU A&B, COVID)  RVPGX2
Influenza A by PCR: POSITIVE — AB
Influenza B by PCR: NEGATIVE
Resp Syncytial Virus by PCR: NEGATIVE
SARS Coronavirus 2 by RT PCR: NEGATIVE

## 2023-10-21 MED ORDER — IBUPROFEN 400 MG PO TABS
400.0000 mg | ORAL_TABLET | Freq: Once | ORAL | Status: AC | PRN
  Administered 2023-10-21: 400 mg via ORAL

## 2023-10-21 MED ORDER — ONDANSETRON 4 MG PO TBDP
4.0000 mg | ORAL_TABLET | Freq: Once | ORAL | Status: AC
  Administered 2023-10-21: 4 mg via ORAL

## 2023-10-21 MED ORDER — ONDANSETRON 4 MG PO TBDP: 4.0000 mg | ORAL_TABLET | Freq: Three times a day (TID) | ORAL | 0 refills | Status: AC | PRN

## 2023-10-21 MED ORDER — ONDANSETRON 4 MG PO TBDP
ORAL_TABLET | ORAL | Status: AC
  Filled 2023-10-21: qty 1

## 2023-10-21 MED ORDER — OSELTAMIVIR PHOSPHATE 75 MG PO CAPS: 75.0000 mg | ORAL_CAPSULE | Freq: Two times a day (BID) | ORAL | 0 refills | Status: AC

## 2023-10-21 MED ORDER — PSEUDOEPH-BROMPHEN-DM 30-2-10 MG/5ML PO SYRP: 5.0000 mL | ORAL_SOLUTION | Freq: Four times a day (QID) | ORAL | 0 refills | Status: AC | PRN

## 2023-10-21 MED ORDER — IBUPROFEN 400 MG PO TABS
ORAL_TABLET | ORAL | Status: AC
  Filled 2023-10-21: qty 1

## 2023-10-21 NOTE — Discharge Instructions (Signed)
The prescription meds as directed.  Follow-up with your primary provider for ongoing concerns.  Return to the ED if needed.

## 2023-10-21 NOTE — ED Notes (Signed)
Patient c/o generalized body aches with cough, congestion, and fever

## 2023-10-21 NOTE — ED Provider Notes (Signed)
[facility] Emergency Department Provider Note     Event Date/Time   First MD Initiated Contact with Patient 10/21/23 0827     (approximate)   History   Generalized Body Aches and Cough   HPI  Brenda Fox is a 33 y.o. female with a noncontributory medical history, presents to the ED with bodyaches, fever, cough, and congestion.  She would also endorse some nausea, vomiting, and diarrhea.  She reports sick contacts at home.  Physical Exam   Triage Vital Signs: ED Triage Vitals  Encounter Vitals Group     BP 10/21/23 0500 120/68     Systolic BP Percentile --      Diastolic BP Percentile --      Pulse Rate 10/21/23 0500 96     Resp 10/21/23 0500 18     Temp 10/21/23 0500 99.4 F (37.4 C)     Temp Source 10/21/23 0500 Oral     SpO2 10/21/23 0500 99 %     Weight 10/21/23 0511 200 lb (90.7 kg)     Height 10/21/23 0511 5\' 5"  (1.651 m)     Head Circumference --      Peak Flow --      Pain Score 10/21/23 0510 9     Pain Loc --      Pain Education --      Exclude from Growth Chart --     Most recent vital signs: Vitals:   10/21/23 0500 10/21/23 1054  BP: 120/68 108/72  Pulse: 96 78  Resp: 18 15  Temp: 99.4 F (37.4 C) 98.4 F (36.9 C)  SpO2: 99% 98%    General Awake, no distress. NAD HEENT NCAT. PERRL. EOMI. No rhinorrhea. Mucous membranes are moist.  CV:  Good peripheral perfusion. RRR RESP:  Normal effort. CTA ABD:  No distention.  MSK:  Active range of motion of all extremities.   ED Results / Procedures / Treatments   Labs (all labs ordered are listed, but only abnormal results are displayed) Labs Reviewed  RESP PANEL BY RT-PCR (RSV, FLU A&B, COVID)  RVPGX2 - Abnormal; Notable for the following components:      Result Value   Influenza A by PCR POSITIVE (*)    All other components within normal limits     EKG   RADIOLOGY  No results found.   PROCEDURES:  Critical Care performed: No  Procedures   MEDICATIONS  ORDERED IN ED: Medications  ibuprofen (ADVIL) tablet 400 mg (400 mg Oral Given 10/21/23 0516)  ondansetron (ZOFRAN-ODT) disintegrating tablet 4 mg (4 mg Oral Given 10/21/23 0518)     IMPRESSION / MDM / ASSESSMENT AND PLAN / ED COURSE  I reviewed the triage vital signs and the nursing notes.                              Differential diagnosis includes, but is not limited to, COVID, flu, RSV, viral URI  Patient's presentation is most consistent with acute complicated illness / injury requiring diagnostic workup.  Patient's diagnosis is consistent with influenza A.  Patient presents with generalized viral symptoms consistent with circulating infections at this time.  Viral panel is confirming influenza A at this time.  Patient in no acute distress, without evidence of of dehydration or toxic appearance.  Patient will be discharged home with prescriptions for Bromfed-DM, Zofran, and Tamiflu. Patient is to follow up with her PCP as discussed,  as needed or otherwise directed. Patient is given ED precautions to return to the ED for any worsening or new symptoms.  FINAL CLINICAL IMPRESSION(S) / ED DIAGNOSES   Final diagnoses:  Influenza A     Rx / DC Orders   ED Discharge Orders          Ordered    ondansetron (ZOFRAN-ODT) 4 MG disintegrating tablet  Every 8 hours PRN        10/21/23 1046    oseltamivir (TAMIFLU) 75 MG capsule  2 times daily        10/21/23 1046    brompheniramine-pseudoephedrine-DM 30-2-10 MG/5ML syrup  4 times daily PRN        10/21/23 1046             Note:  This document was prepared using Dragon voice recognition software and may include unintentional dictation errors.    Lissa Hoard, PA-C 10/24/23 1537    Chesley Noon, MD 10/24/23 [phone]

## 2023-10-21 NOTE — ED Triage Notes (Addendum)
Pt in via POV from home, reports ongoing generalized body aches w/ fever, cough, nasal congestion since Friday.  Also endorses some N/V, denies diarrhea.  NAD noted at this time.

## 2024-04-22 ENCOUNTER — Other Ambulatory Visit: Payer: Self-pay | Admitting: Family Medicine

## 2024-04-22 DIAGNOSIS — N644 Mastodynia: Secondary | ICD-10-CM

## 2024-04-23 ENCOUNTER — Ambulatory Visit
Admission: RE | Admit: 2024-04-23 | Discharge: 2024-04-23 | Disposition: A | Discharge status: Home / Self Care | Source: Ambulatory Visit | Attending: Family Medicine | Admitting: Family Medicine

## 2024-04-23 DIAGNOSIS — N644 Mastodynia: Secondary | ICD-10-CM | POA: Insufficient documentation

## 2024-04-30 ENCOUNTER — Ambulatory Visit
Admission: RE | Admit: 2024-04-30 | Discharge: 2024-04-30 | Disposition: A | Discharge status: Home / Self Care | Source: Ambulatory Visit | Attending: Family Medicine | Admitting: Family Medicine

## 2024-04-30 DIAGNOSIS — N644 Mastodynia: Secondary | ICD-10-CM | POA: Diagnosis present
